# Patient Record
Sex: Female | Born: 1955 | Race: White | Hispanic: No | Marital: Married | Smoking: Never smoker
Health system: Southern US, Community
[De-identification: ages and names within clinical notes are randomized; demographics above are authoritative.]

## PROBLEM LIST (undated history)

## (undated) DIAGNOSIS — C539 Malignant neoplasm of cervix uteri, unspecified: Secondary | ICD-10-CM

## (undated) DIAGNOSIS — Z860100 Personal history of colon polyps, unspecified: Secondary | ICD-10-CM

## (undated) DIAGNOSIS — C50919 Malignant neoplasm of unspecified site of unspecified female breast: Secondary | ICD-10-CM

## (undated) HISTORY — DX: Malignant neoplasm of cervix uteri, unspecified: C53.9

## (undated) HISTORY — PX: REDUCTION MAMMAPLASTY: SUR839

## (undated) HISTORY — PX: COLONOSCOPY: SHX174

## (undated) HISTORY — PX: MASTECTOMY: SHX3

## (undated) HISTORY — DX: Malignant neoplasm of unspecified site of unspecified female breast: C50.919

## (undated) HISTORY — DX: Personal history of colon polyps, unspecified: Z86.0100

## (undated) HISTORY — PX: ABDOMINAL HYSTERECTOMY: SHX81

## (undated) HISTORY — DX: Personal history of colonic polyps: Z86.010

---

## 1989-09-07 DIAGNOSIS — C539 Malignant neoplasm of cervix uteri, unspecified: Secondary | ICD-10-CM

## 1989-09-07 HISTORY — DX: Malignant neoplasm of cervix uteri, unspecified: C53.9

## 1999-04-28 ENCOUNTER — Other Ambulatory Visit: Admission: RE | Admit: 1999-04-28 | Discharge: 1999-04-28 | Payer: Self-pay | Admitting: Gynecology

## 2001-06-13 ENCOUNTER — Other Ambulatory Visit: Admission: RE | Admit: 2001-06-13 | Discharge: 2001-06-13 | Payer: Self-pay | Admitting: Gynecology

## 2002-09-07 HISTORY — PX: BREAST LUMPECTOMY: SHX2

## 2002-12-25 ENCOUNTER — Other Ambulatory Visit: Admission: RE | Admit: 2002-12-25 | Discharge: 2002-12-25 | Payer: Self-pay | Admitting: Gynecology

## 2003-01-18 ENCOUNTER — Other Ambulatory Visit: Admission: RE | Admit: 2003-01-18 | Discharge: 2003-01-18 | Payer: Self-pay | Admitting: *Deleted

## 2003-01-18 ENCOUNTER — Encounter (INDEPENDENT_AMBULATORY_CARE_PROVIDER_SITE_OTHER): Payer: Self-pay | Admitting: *Deleted

## 2003-01-18 ENCOUNTER — Encounter: Admission: RE | Admit: 2003-01-18 | Discharge: 2003-01-18 | Payer: Self-pay | Admitting: *Deleted

## 2003-01-23 ENCOUNTER — Encounter (HOSPITAL_COMMUNITY): Admission: RE | Admit: 2003-01-23 | Discharge: 2003-04-23 | Payer: Self-pay | Admitting: *Deleted

## 2003-02-07 ENCOUNTER — Encounter: Admission: RE | Admit: 2003-02-07 | Discharge: 2003-02-07 | Payer: Self-pay | Admitting: *Deleted

## 2003-02-07 ENCOUNTER — Ambulatory Visit (HOSPITAL_BASED_OUTPATIENT_CLINIC_OR_DEPARTMENT_OTHER): Admission: RE | Admit: 2003-02-07 | Discharge: 2003-02-07 | Payer: Self-pay | Admitting: *Deleted

## 2003-02-07 ENCOUNTER — Encounter (INDEPENDENT_AMBULATORY_CARE_PROVIDER_SITE_OTHER): Payer: Self-pay | Admitting: *Deleted

## 2003-02-18 ENCOUNTER — Emergency Department (HOSPITAL_COMMUNITY): Admission: EM | Admit: 2003-02-18 | Discharge: 2003-02-18 | Payer: Self-pay | Admitting: Emergency Medicine

## 2003-03-07 ENCOUNTER — Ambulatory Visit (HOSPITAL_BASED_OUTPATIENT_CLINIC_OR_DEPARTMENT_OTHER): Admission: RE | Admit: 2003-03-07 | Discharge: 2003-03-07 | Payer: Self-pay | Admitting: *Deleted

## 2003-03-07 ENCOUNTER — Encounter (INDEPENDENT_AMBULATORY_CARE_PROVIDER_SITE_OTHER): Payer: Self-pay | Admitting: Specialist

## 2003-03-26 ENCOUNTER — Ambulatory Visit: Admission: RE | Admit: 2003-03-26 | Discharge: 2003-04-04 | Payer: Self-pay | Admitting: *Deleted

## 2003-05-16 ENCOUNTER — Ambulatory Visit: Admission: RE | Admit: 2003-05-16 | Discharge: 2003-07-11 | Payer: Self-pay | Admitting: *Deleted

## 2003-08-10 ENCOUNTER — Ambulatory Visit: Admission: RE | Admit: 2003-08-10 | Discharge: 2003-08-10 | Payer: Self-pay | Admitting: *Deleted

## 2003-12-26 ENCOUNTER — Other Ambulatory Visit: Admission: RE | Admit: 2003-12-26 | Discharge: 2003-12-26 | Payer: Self-pay | Admitting: Gynecology

## 2004-11-28 ENCOUNTER — Ambulatory Visit: Admission: RE | Admit: 2004-11-28 | Discharge: 2004-11-28 | Payer: Self-pay | Admitting: Family Medicine

## 2005-04-13 ENCOUNTER — Other Ambulatory Visit: Admission: RE | Admit: 2005-04-13 | Discharge: 2005-04-13 | Payer: Self-pay | Admitting: Gynecology

## 2006-04-14 ENCOUNTER — Other Ambulatory Visit: Admission: RE | Admit: 2006-04-14 | Discharge: 2006-04-14 | Payer: Self-pay | Admitting: Gynecology

## 2008-03-13 ENCOUNTER — Other Ambulatory Visit: Admission: RE | Admit: 2008-03-13 | Discharge: 2008-03-13 | Payer: Self-pay | Admitting: Gynecology

## 2008-10-25 ENCOUNTER — Inpatient Hospital Stay (HOSPITAL_COMMUNITY): Admission: RE | Admit: 2008-10-25 | Discharge: 2008-10-27 | Payer: Self-pay | Admitting: General Surgery

## 2008-10-25 ENCOUNTER — Encounter (INDEPENDENT_AMBULATORY_CARE_PROVIDER_SITE_OTHER): Payer: Self-pay | Admitting: General Surgery

## 2008-12-21 ENCOUNTER — Ambulatory Visit: Payer: Self-pay | Admitting: Oncology

## 2009-04-02 ENCOUNTER — Ambulatory Visit: Payer: Self-pay | Admitting: Internal Medicine

## 2009-04-12 ENCOUNTER — Ambulatory Visit: Payer: Self-pay | Admitting: Internal Medicine

## 2009-04-12 ENCOUNTER — Encounter: Payer: Self-pay | Admitting: Internal Medicine

## 2009-04-20 ENCOUNTER — Encounter: Payer: Self-pay | Admitting: Internal Medicine

## 2009-08-06 ENCOUNTER — Ambulatory Visit: Payer: Self-pay | Admitting: Gynecology

## 2009-08-06 ENCOUNTER — Other Ambulatory Visit: Admission: RE | Admit: 2009-08-06 | Discharge: 2009-08-06 | Payer: Self-pay | Admitting: Gynecology

## 2010-09-09 ENCOUNTER — Ambulatory Visit: Admit: 2010-09-09 | Payer: Self-pay | Admitting: Gynecology

## 2010-09-29 ENCOUNTER — Encounter: Payer: Self-pay | Admitting: Neurology

## 2010-10-10 ENCOUNTER — Other Ambulatory Visit: Payer: Self-pay | Admitting: Obstetrics and Gynecology

## 2010-10-10 ENCOUNTER — Other Ambulatory Visit (HOSPITAL_COMMUNITY)
Admission: RE | Admit: 2010-10-10 | Discharge: 2010-10-10 | Disposition: A | Discharge status: Home / Self Care | Payer: BC Managed Care – PPO | Source: Ambulatory Visit | Attending: Gynecology | Admitting: Gynecology

## 2010-10-10 ENCOUNTER — Encounter: Payer: BC Managed Care – PPO | Admitting: Gynecology

## 2010-10-10 DIAGNOSIS — Z124 Encounter for screening for malignant neoplasm of cervix: Secondary | ICD-10-CM | POA: Insufficient documentation

## 2010-10-10 DIAGNOSIS — B3731 Acute candidiasis of vulva and vagina: Secondary | ICD-10-CM

## 2010-10-10 DIAGNOSIS — Z01419 Encounter for gynecological examination (general) (routine) without abnormal findings: Secondary | ICD-10-CM

## 2010-10-10 DIAGNOSIS — Z13220 Encounter for screening for lipoid disorders: Secondary | ICD-10-CM

## 2010-10-10 DIAGNOSIS — N9089 Other specified noninflammatory disorders of vulva and perineum: Secondary | ICD-10-CM

## 2010-10-10 DIAGNOSIS — B373 Candidiasis of vulva and vagina: Secondary | ICD-10-CM

## 2010-11-10 ENCOUNTER — Other Ambulatory Visit: Payer: BC Managed Care – PPO

## 2010-12-23 LAB — COMPREHENSIVE METABOLIC PANEL
BUN: 14 mg/dL (ref 6–23)
Calcium: 9.3 mg/dL (ref 8.4–10.5)
Glucose, Bld: 130 mg/dL — ABNORMAL HIGH (ref 70–99)
Total Protein: 6.5 g/dL (ref 6.0–8.3)

## 2010-12-23 LAB — CBC
HCT: 40 % (ref 36.0–46.0)
Hemoglobin: 14.2 g/dL (ref 12.0–15.0)
MCHC: 35.4 g/dL (ref 30.0–36.0)
Platelets: 263 10*3/uL (ref 150–400)
RDW: 12.4 % (ref 11.5–15.5)

## 2010-12-23 LAB — DIFFERENTIAL
Lymphocytes Relative: 25 % (ref 12–46)
Lymphs Abs: 1.5 10*3/uL (ref 0.7–4.0)
Monocytes Relative: 7 % (ref 3–12)
Neutro Abs: 3.9 10*3/uL (ref 1.7–7.7)
Neutrophils Relative %: 66 % (ref 43–77)

## 2010-12-23 LAB — HEMOGLOBIN AND HEMATOCRIT, BLOOD: Hemoglobin: 12 g/dL (ref 12.0–15.0)

## 2011-01-20 NOTE — Discharge Summary (Signed)
[name], Robin Curry NO.:  [id number]   MEDICAL RECORD NO.:  [id number]          PATIENT TYPE:  INP   LOCATION:  5158                         FACILITY:  [facility]   PHYSICIAN:  Etter Sjogren, M.D.     DATE OF BIRTH:  04/25/1956   DATE OF ADMISSION:  10/25/2008  DATE OF DISCHARGE:  10/27/2008                               DISCHARGE SUMMARY   FINAL DIAGNOSIS:  Left breast cancer.   PROCEDURES PERFORMED:  1. Left total mastectomy.  2. Left sentinel node.  3. Left breast reconstruction latissimus dorsi myocutaneous flap.  4. Placement of a saline implant, left chest.  5. Placed ON-Q pump device.   SUMMARY OF HISTORY AND PHYSICAL:  This 55 year old woman has breast  cancer and desired reconstruction.  Options discussed.  She was  scheduled for a mastectomy and did desire reconstruction and she was not  a good candidate for the TRAM reconstruction because of the high BMI,  and the options of tissue expander with eventual placement of implant  versus using her latissimus muscle in the implant were discussed, and  she preferred to use her latissimus muscle and implant.  This procedure  was discussed with her in great detail, including the risks and possible  complications and she understood all this and wished to proceed.   COURSE IN HOSPITAL:  On admission, she was taken to surgery at which  time the mastectomy with the sentinel lymph node and the reconstruction  with latissimus muscle flap and the implant and placement of the ON-Q  catheters were performed.  She tolerated these procedures extremely  well.   Postoperatively, she progressed very well.  She remained afebrile.  Drains were functional.  The flap maintained excellent color.  Capillary  refill at 1-2 seconds, viable, and the donor site was fine.  I did not  notice any hematoma.  She was ambulating on the first postoperative day  and tolerating diet and continued to progress well.  Is tolerating p.o.  pain  pills by the first and second postoperative day, and it is felt  that she is ready to be discharged.  Her final hemoglobin was over 12.   DISPOSITION:  Discharged on regular diet.  No shower.  No lifting.  No  raising arm overhead.  Empty the drains at least 3 times a day and  record the amount separately.  Use a spirometer at home at least 8 times  a day.   PRESCRIPTIONS:  1. Keflex 500 mg p.o. q.i.d.  2. Robaxin 500 mg q.8 h.  3. Percocet 5 mg, a total of 40 given, one p.o. q.4 h. or 2 p.o. q.6      h. p.r.n. pain.   Followup in the office in about a week.  She will call the office at the  end of week to report the amount of drainage.  She will also follow up  with Dr. Dwain Sarna of her path results as well.      Etter Sjogren, M.D.  Electronically Signed     DB/MEDQ  D:  10/27/2008  T:  10/27/2008  Job:  [id number]

## 2011-01-20 NOTE — Op Note (Signed)
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   POSTOPERATIVE DIAGNOSIS:  Recurrent left breast ductal carcinoma in  situ.   PROCEDURES:  1. Left simple mastectomy.  2. Injection of methylene blue dye  3. Left axillary sentinel lymph node biopsy.   SURGEON:  Juanetta Gosling, MD   ASSISTANT:  Ardeth Sportsman, MD   ANESTHESIA:  General.   SPECIMENS:  Left breast oriented and 3 sentinel lymph nodes to  pathology.   ESTIMATED BLOOD LOSS:  Minimal.   COMPLICATIONS:  None.   DRAINS:  Per Dr. Odis Luster.   DISPOSITION:  Case turned over to Dr. Etter Sjogren for reconstruction at  the completion.   HISTORY:  Robin Curry is a 55 year old female in 2004 underwent a left  breast lumpectomy for high-grade DCIS.  The margin was positive at the  first and she underwent a re-excision of this area.  There was no  further DCIS present.  Postoperatively, this was complicated by an open  wound that took a number of weeks to heel and this is followed by  radiation.  She had an abnormal mammogram that was followed about 6  months closely with another one, then on January 22, underwent a  stereotactic biopsy which showed a focal high-grade DCIS associated with  necrosis and calcifications.  She then saw me for evaluation.  We  discussed a left mastectomy with attempted sentinel lymph node biopsy,  given her prior treatment also.  She was seen by Dr. Etter Sjogren who  plans to do a latissimus flap as well as an implant at the same time.   PROCEDURE:  After informed consent was obtained, the patient first was  injected  with radioactive tracer.  She was administered 1 g of  intravenous cefazolin.  Sequential compression devices were placed on  her lower extremities throughout the operation.  Her left breast was  then prepped and draped in a standard sterile surgical fashion with  chlorhexidine.  A surgical time-out was performed.   An teardrop-shaped incision encompassing her old incision was then made.  Dissection was carried out down to level of the breast tissue.  Flaps  were created in all directions to the clavicle, below the inframammary  crease, the sternum, and the latissimus.  These flaps were difficult to  develop likely due to her prior radiation.  There were several areas on  the flaps that were bleeding that I controlled with electrocautery as  well as several sutures.  Upon completing this, the breast was then  removed from the pectoralis muscle with some difficulty.  It was hard to  develop a plane between the pectoralis and the fascia, but the fascia  was removed along with the breast tissue.  A  stick tie was placed in a  perforator that was visualized at this time.  The breast was then taken  laterally and released at the point of the latissimus.  I then placed a  probe on the axillary tail and noted 2 sentinel lymph nodes that were  present in the axillary tail that were both hot and blue.  I also  identified an additional sentinel lymph node x2 present in her axilla  with counts in the 2500 range.  The counts upon completion were about  100.  Hemostasis was observed.  Irrigation was performed.  I then  stapled the skin close and stapled a sterile towel over the incision.  This case was then turned over to Dr. Etter Sjogren for reconstruction.      Juanetta Gosling, MD  Electronically Signed     MCW/MEDQ  D:  10/25/2008  T:  10/26/2008  Job:  [id number]   cc:   Marcial Pacas P. Fontaine, M.D.

## 2011-01-20 NOTE — Op Note (Signed)
Robin Curry, Robin Curry NO.:  [id number]   MEDICAL RECORD NO.:  [id number]          PATIENT TYPE:  INP   LOCATION:  2899                         FACILITY:  [facility]   PHYSICIAN:  Etter Sjogren, M.D.     DATE OF BIRTH:  02-Oct-1955   DATE OF PROCEDURE:  10/25/2008  DATE OF DISCHARGE:                               OPERATIVE REPORT   PREOPERATIVE DIAGNOSIS:  Left breast cancer.   POSTOPERATIVE DIAGNOSIS:  Left breast cancer.   PROCEDURE:  1. Left latissimus dorsi myocutaneous flap.  2. Placement of a saline implant.  3. Placement of On-Q pump catheters.   SURGEON:  Etter Sjogren, MD   ASSISTANT:  Pleas Patricia, MD   ANESTHESIA:  General.   ESTIMATED BLOOD LOSS:  125 mL.   DRAINS:  Two Blakes through the back and two Blakes for the front and  anterior chest.   CLINICAL NOTE:  A 55 year old woman has breast cancer, has had previous  radiation and had a failure and now presents for mastectomy and she is  very interested in reconstruction.  Options discussed due to her high  BMI and the need for autogenous tissue given the previous radiation, it  was felt that latissimus flap was a very good option for her and that  the TRAM flap would not be a good option because of significant risk to  the donor site of the abdomen.  This procedure was discussed with her in  great detail and she understood the initial procedure and the risks and  the fact that further adjustments maybe needed and risks including but  not limited to bleeding, infection, anesthesia complications, healing  problems, scarring, loss of sensation, fluid collections, asymmetry,  displacing the implant for the implant capsular contracture,  disappointment, possibly further surgeries, pulmonary embolism, damage  to the chest cavity, pneumothorax, and she understood all this and  wished to proceed.   PROCEDURE:  The patient had been marked this morning in the office for  the reconstruction.  The  mastectomy had been completed.  She was placed  in a right lateral decubitus position on the beanbag with a sterile  dressing covering the closed mastectomy site.  The sterile dressing was  then removed.  She was prepped with Betadine and draped with sterile  drapes.  The skin paddle was then incised, dissection was carried out  through the subcutaneous tissue identifying the underlying latissimus  muscle.  The muscle was exposed anteriorly, the posterior surface of the  muscle was then freed by taking great care to avoid damage to the  thoracodorsal pedicle.  The muscle was then divided distally and then  posteriorly with perforating lumbar vessels tied with 3-0 Vicryl sutures  and a smaller perforating vessels double ligated with Ligaclips.  The  flap having been elevated had excellent color and was transposed into  the mastectomy wound where subcutaneous tunnel then made connecting to  the mastectomy wound.  Thorough irrigation with saline, hemostasis with  electrocautery.  After hemostasis had been confirmed, 2 Blake drains  were positioned, brought through separate stab wounds anteroinferiorly  and secured with 3-0 Prolene  sutures and then the On-Q catheters were  placed.  The needles were passed.  The On-Q catheters were fed through  the sheath.  Sheath was removed, the catheters were positioned  superiorly and inferiorly over the muscle bed.  These were then affixed  to the skin using Steri-Strips and a Tegaderm and the flow rate set at 2  mL/hour/catheter.  Hemostasis again confirmed and the closure of the  deep wound with #1 PDS in interrupted inverted sutures, 2-0 Monocryl  interrupted inverted deep dermal sutures, and 3-0 Monocryl running  subcuticular suture.  Steri-Strips and dry sterile dressing and this was  covered with a large OpSite.  The patient was then gently rolled into  supine position there and again the mastectomy wound was covered with  sterile dressing.  She was  prepped with Betadine and draped with sterile  drapes.  The skin staples that had closed the mastectomy wound was then  opened.  The skin paddle was inspected.  It had excellent color and good  capillary refill 1-2 seconds with a healthy muscle color as well.  The  muscle was then set along as inferior and lateral border using 3-0  Vicryl interrupted figure-of-eight sutures and then thoroughly  irrigating with saline and Blake drains was positioned, one in left  under the muscle flap and the other one in left underneath the  mastectomy flaps.  After hemostasis having been confirmed and achieved,  the antibiotic solution was then placed in the wound and allowed to  dwell and the implant was prepared.  After thoroughly cleaning gloves,  the implant was prepared.  This was a Mentor smooth, round, moderate  profile saline implant 325 plus 50, maximum fill 375 mL, plot number  [id number].  A 250 mL of sterile saline placed, the air was removed and  then the implant was positioned underneath the muscle flap.  A 3-0  Vicryl sutures were then placed to complete a portion of the superior  closure.  The implant was filled to the maximum of 375 mL, all of this  using a closed fill system, but with saline.  The implant was soaked in  antibiotic solution prior to implantation.  It was soaked for greater  than 5 minutes.  The muscle closure was completed superiorly taking  great care to avoid damage underlying implant with 3-0 Vicryl  interrupted figure-of-eight sutures.  A thorough irrigation again with  saline, hemostasis confirmed and the closure with 3-0 Monocryl inverted  deep dermal sutures.  At either end of the flap, there was a little bit  of extra skin and this was incised and de-epithelialized and buried for  additional volume.  The closure was completed with 3-0 Monocryl inverted  deep dermal sutures and running 3-0 Monocryl subcuticular suture.  The  flap continued to have excellent color and  certainly had a nice bleeding  when the either end both anterior and posterior were de-epithelialized.  Steri-Strips, dry sterile dressing lightly applied and she was  transferred to the recovery room stable, had tolerated the procedure  well.      Etter Sjogren, M.D.  Electronically Signed     DB/MEDQ  D:  10/25/2008  T:  10/26/2008  Job:  [id number]

## 2011-01-23 NOTE — Op Note (Signed)
   NAME:  Robin Curry, Robin Curry                     ACCOUNT NO.:  [id number]   MEDICAL RECORD NO.:  [id number]                   PATIENT TYPE:  AMB   LOCATION:  DSC                                  FACILITY:  [facility]   PHYSICIAN:  Vikki Ports, M.D.         DATE OF BIRTH:  28-Sep-1955   DATE OF PROCEDURE:  02/07/2003  DATE OF DISCHARGE:                                 OPERATIVE REPORT   PREOPERATIVE DIAGNOSIS:  Ductal carcinoma in situ of the left breast.   POSTOPERATIVE DIAGNOSIS:  Ductal carcinoma in situ of the left breast.   OPERATION:  Needle localization, left partial mastectomy with specimen  mammography.   SURGEON:  Vikki Ports, M.D.   ANESTHESIA:  General   DESCRIPTION OF PROCEDURE:  The patient was taken to the operating room and  placed in the supine position.  After adequate general anesthesia was  induced, the left breast was prepped and draped in the normal sterile  fashion.  Using a vertically oriented incision over the lateral portion of  the left breast along the tract of the guide wire I excised all tissues to  the retroareolar space.  Cavity was marked with clips.  The margins were  marked with radiopaque markers and the calcifications are present on the  mammograms and the margins appear clear radiographically.  Adequate  hemostasis was assured and the skin was closed with subcuticular 4-0  Monocryl.  Steri-Strips and sterile dressings were applied.  The patient  tolerated the procedure well and went to PACU in good condition.                                               Vikki Ports, M.D.    KRH/MEDQ  D:  02/13/2003  T:  02/13/2003  Job:  [id number]

## 2011-01-23 NOTE — Op Note (Signed)
   NAME:  Robin Curry, Robin Curry                     ACCOUNT NO.:  [id number]   MEDICAL RECORD NO.:  [id number]                   PATIENT TYPE:  AMB   LOCATION:  DSC                                  FACILITY:  [facility]   PHYSICIAN:  Vikki Ports, M.D.         DATE OF BIRTH:  [birth year]   DATE OF PROCEDURE:  03/07/2003  DATE OF DISCHARGE:  03/07/2003                                 OPERATIVE REPORT   PREOPERATIVE DIAGNOSIS:  Ductal carcinoma in situ of the left breast with  involvement of margins from previous partial mastectomy.   POSTOPERATIVE DIAGNOSIS:  Ductal carcinoma in situ of the left breast with  involvement of margins from previous partial mastectomy.   PROCEDURE:  Wide excision of left breast ductal carcinoma in situ upper  outer quadrant.   ANESTHESIA:  General.   DESCRIPTION OF PROCEDURE:  The patient was taken to the operating room and  placed in a supine position.  After a general anesthesia was induced, the  left breast was prepped and draped in the normal sterile fashion.  An  elliptical incision was made around the previous incision site and the  cavity was entered.  Medial, inferior, and superior margins were taken at a  depth of an additional 1 cm.  The new margins were clipped with hemoclips  for radiologic identification or margins.  Adequate hemostasis of the cavity  was insured.  It was closed with running subcutaneous 3-0 Vicryl and a  subcuticular 4-0 Monocryl. Steri-Strip and sterile dressings were applied.  The patient tolerated the procedure well and went to the PACU in good  condition.                                               Vikki Ports, M.D.    KRH/MEDQ  D:  04/16/2003  T:  04/16/2003  Job:  [id number]

## 2012-03-07 ENCOUNTER — Encounter: Payer: Self-pay | Admitting: Internal Medicine

## 2012-03-08 ENCOUNTER — Encounter: Payer: Self-pay | Admitting: Gastroenterology

## 2012-04-08 ENCOUNTER — Other Ambulatory Visit: Payer: Self-pay | Admitting: Gynecology

## 2012-04-08 DIAGNOSIS — Z1231 Encounter for screening mammogram for malignant neoplasm of breast: Secondary | ICD-10-CM

## 2012-04-08 DIAGNOSIS — Z9012 Acquired absence of left breast and nipple: Secondary | ICD-10-CM

## 2012-04-29 ENCOUNTER — Ambulatory Visit
Admission: RE | Admit: 2012-04-29 | Discharge: 2012-04-29 | Disposition: A | Discharge status: Home / Self Care | Payer: BC Managed Care – PPO | Source: Ambulatory Visit | Attending: Gynecology | Admitting: Gynecology

## 2012-04-29 DIAGNOSIS — Z9012 Acquired absence of left breast and nipple: Secondary | ICD-10-CM

## 2012-04-29 DIAGNOSIS — Z1231 Encounter for screening mammogram for malignant neoplasm of breast: Secondary | ICD-10-CM

## 2012-08-02 ENCOUNTER — Encounter: Payer: Self-pay | Admitting: Internal Medicine

## 2012-09-08 ENCOUNTER — Ambulatory Visit (AMBULATORY_SURGERY_CENTER): Payer: BC Managed Care – PPO

## 2012-09-08 ENCOUNTER — Encounter: Payer: Self-pay | Admitting: Internal Medicine

## 2012-09-08 VITALS — Ht 61.0 in | Wt 236.6 lb

## 2012-09-08 DIAGNOSIS — Z860100 Personal history of colon polyps, unspecified: Secondary | ICD-10-CM

## 2012-09-08 MED ORDER — NA SULFATE-K SULFATE-MG SULF 17.5-3.13-1.6 GM/177ML PO SOLN: 1.0000 | Freq: Once | ORAL | Status: DC

## 2012-09-19 ENCOUNTER — Ambulatory Visit (AMBULATORY_SURGERY_CENTER): Payer: BC Managed Care – PPO | Admitting: Internal Medicine

## 2012-09-19 ENCOUNTER — Encounter: Payer: Self-pay | Admitting: Internal Medicine

## 2012-09-19 VITALS — BP 143/74 | HR 68 | Temp 97.5°F | Resp 16 | Ht 61.0 in | Wt 236.0 lb

## 2012-09-19 DIAGNOSIS — Z860100 Personal history of colon polyps, unspecified: Secondary | ICD-10-CM | POA: Insufficient documentation

## 2012-09-19 DIAGNOSIS — Z1211 Encounter for screening for malignant neoplasm of colon: Secondary | ICD-10-CM

## 2012-09-19 DIAGNOSIS — D126 Benign neoplasm of colon, unspecified: Secondary | ICD-10-CM

## 2012-09-19 MED ORDER — SODIUM CHLORIDE 0.9 % IV SOLN: 500.0000 mL | INTRAVENOUS | Status: DC

## 2012-09-19 NOTE — Progress Notes (Signed)
Patient did not experience any of the following events: a burn prior to discharge; a fall within the facility; wrong site/side/patient/procedure/implant event; or a hospital transfer or hospital admission upon discharge from the facility. (G8907) Patient did not have preoperative order for IV antibiotic SSI prophylaxis. (G8918)  

## 2012-09-19 NOTE — Patient Instructions (Addendum)
One tiny polyp was removed. I suspect I will recommend a repeat colonoscopy in 5 years but will let you know for certain by mail.  I will let Dr. Cliffton Asters know also.  Thank you for choosing me and [facility].  Iva Boop, MD, FACG   YOU HAD AN ENDOSCOPIC PROCEDURE TODAY AT THE [facility]: Refer to the procedure report that was given to you for any specific questions about what was found during the examination.  If the procedure report does not answer your questions, please call your gastroenterologist to clarify.  If you requested that your care partner not be given the details of your procedure findings, then the procedure report has been included in a sealed envelope for you to review at your convenience later.  YOU SHOULD EXPECT: Some feelings of bloating in the abdomen. Passage of more gas than usual.  Walking can help get rid of the air that was put into your GI tract during the procedure and reduce the bloating. If you had a lower endoscopy (such as a colonoscopy or flexible sigmoidoscopy) you may notice spotting of blood in your stool or on the toilet paper. If you underwent a bowel prep for your procedure, then you may not have a normal bowel movement for a few days.  DIET: Your first meal following the procedure should be a light meal and then it is ok to progress to your normal diet.  A half-sandwich or bowl of soup is an example of a good first meal.  Heavy or fried foods are harder to digest and may make you feel nauseous or bloated.  Likewise meals heavy in dairy and vegetables can cause extra gas to form and this can also increase the bloating.  Drink plenty of fluids but you should avoid alcoholic beverages for 24 hours.  ACTIVITY: Your care partner should take you home directly after the procedure.  You should plan to take it easy, moving slowly for the rest of the day.  You can resume normal activity the day after the procedure however you should NOT  DRIVE or use heavy machinery for 24 hours (because of the sedation medicines used during the test).    SYMPTOMS TO REPORT IMMEDIATELY: A gastroenterologist can be reached at any hour.  During normal business hours, 8:30 AM to 5:00 PM Monday through Friday, call [phone].  After hours and on weekends, please call the GI answering service at [phone] who will take a message and have the physician on call contact you.   Following lower endoscopy (colonoscopy or flexible sigmoidoscopy):  Excessive amounts of blood in the stool  Significant tenderness or worsening of abdominal pains  Swelling of the abdomen that is new, acute  Fever of 100F or higher    FOLLOW UP: If any biopsies were taken you will be contacted by phone or by letter within the next 1-3 weeks.  Call your gastroenterologist if you have not heard about the biopsies in 3 weeks.  Our staff will call the home number listed on your records the next business day following your procedure to check on you and address any questions or concerns that you may have at that time regarding the information given to you following your procedure. This is a courtesy call and so if there is no answer at the home number and we have not heard from you through the emergency physician on call, we will assume that you have returned to your regular daily activities  without incident.  SIGNATURES/CONFIDENTIALITY: You and/or your care partner have signed paperwork which will be entered into your electronic medical record.  These signatures attest to the fact that that the information above on your After Visit Summary has been reviewed and is understood.  Full responsibility of the confidentiality of this discharge information lies with you and/or your care-partner.    INFORMATION ON POLYPS GIVEN TO YOU TODAY

## 2012-09-19 NOTE — Progress Notes (Signed)
Called to room to assist during endoscopic procedure.  Patient ID and intended procedure confirmed with present staff. Received instructions for my participation in the procedure from the performing physician.  

## 2012-09-19 NOTE — Op Note (Signed)
[facility] [address] [place], [address]   COLONOSCOPY PROCEDURE REPORT  PATIENT: Robin, Curry  MR#: [id number] BIRTHDATE: Mar 14, 1956 , 56  yrs. old GENDER: Female ENDOSCOPIST: Iva Boop, MD, [facility] PROCEDURE DATE:  09/19/2012 PROCEDURE:   Colonoscopy with biopsy ASA CLASS:   Class II INDICATIONS:Screening and surveillance,personal history of colonic polyps. MEDICATIONS: propofol (Diprivan) 200mg  IV, MAC sedation, administered by CRNA, and These medications were titrated to patient response per physician's verbal order  DESCRIPTION OF PROCEDURE:   After the risks benefits and alternatives of the procedure were thoroughly explained, informed consent was obtained.  A digital rectal exam revealed no abnormalities of the rectum.   The LB [id number]  endoscope was introduced through the anus and advanced to the cecum, which was identified by both the appendix and ileocecal valve. No adverse events experienced.   The quality of the prep was Suprep excellent The instrument was then slowly withdrawn as the colon was fully examined.      COLON FINDINGS: A smooth sessile polyp measuring 2 mm in size was found at the hepatic flexure.  A polypectomy was performed with cold forceps.  The resection was complete and the polyp tissue was completely retrieved.   The colon mucosa was otherwise normal.   A right colon retroflexion was performed.  Retroflexed views revealed no abnormalities. The time to cecum=1 minutes 30 seconds. Withdrawal time=9 minutes 20 seconds.  The scope was withdrawn and the procedure completed. COMPLICATIONS: There were no complications.  ENDOSCOPIC IMPRESSION: 1.   Sessile polyp measuring 2 mm in size was found at the hepatic flexure; polypectomy was performed with cold forceps 2.   The colon mucosa was otherwise normal - excellent prep in patient with history of 7 mm tubulovillous adenoma removal in  2010  RECOMMENDATIONS: Timing of repeat colonoscopy will be determined by pathology findings.   eSigned:  Iva Boop, MD, [facility] 09/19/2012 11:56 AM   cc: The Patient and Laurann Montana, MD

## 2012-09-20 ENCOUNTER — Telehealth: Payer: Self-pay

## 2012-09-20 NOTE — Telephone Encounter (Signed)
  Follow up Call-  Call back number 09/19/2012  Post procedure Call Back phone  # [phone]  Permission to leave phone message Yes     Patient questions:  Do you have a fever, pain , or abdominal swelling? no Pain Score  0 *  Have you tolerated food without any problems? yes  Have you been able to return to your normal activities? yes  Do you have any questions about your discharge instructions: Diet   no Medications  no Follow up visit  no  Do you have questions or concerns about your Care? no  Actions: * If pain score is 4 or above: No action needed, pain <4.  Per the pt, "everything went great".  Maw

## 2012-09-22 ENCOUNTER — Encounter: Payer: Self-pay | Admitting: Internal Medicine

## 2012-09-22 NOTE — Progress Notes (Signed)
Quick Note:  Diminutive 2 mm adenoma Repeat colon about 09/2017 ______

## 2013-06-19 ENCOUNTER — Other Ambulatory Visit: Payer: Self-pay

## 2013-06-19 DIAGNOSIS — Z1231 Encounter for screening mammogram for malignant neoplasm of breast: Secondary | ICD-10-CM

## 2013-07-04 ENCOUNTER — Ambulatory Visit
Admission: RE | Admit: 2013-07-04 | Discharge: 2013-07-04 | Disposition: A | Discharge status: Home / Self Care | Payer: BC Managed Care – PPO | Source: Ambulatory Visit

## 2013-07-04 DIAGNOSIS — Z1231 Encounter for screening mammogram for malignant neoplasm of breast: Secondary | ICD-10-CM

## 2013-07-25 ENCOUNTER — Telehealth: Payer: Self-pay

## 2013-07-25 ENCOUNTER — Encounter: Payer: Self-pay | Admitting: Gynecology

## 2013-07-25 ENCOUNTER — Other Ambulatory Visit: Payer: Self-pay

## 2013-07-25 ENCOUNTER — Other Ambulatory Visit: Payer: Self-pay | Admitting: Gynecology

## 2013-07-25 ENCOUNTER — Other Ambulatory Visit (HOSPITAL_COMMUNITY)
Admission: RE | Admit: 2013-07-25 | Discharge: 2013-07-25 | Disposition: A | Discharge status: Home / Self Care | Payer: BC Managed Care – PPO | Source: Ambulatory Visit | Attending: Gynecology | Admitting: Gynecology

## 2013-07-25 ENCOUNTER — Ambulatory Visit (INDEPENDENT_AMBULATORY_CARE_PROVIDER_SITE_OTHER): Payer: BC Managed Care – PPO | Admitting: Gynecology

## 2013-07-25 VITALS — BP 120/78 | Ht 61.0 in | Wt 192.0 lb

## 2013-07-25 DIAGNOSIS — C50919 Malignant neoplasm of unspecified site of unspecified female breast: Secondary | ICD-10-CM

## 2013-07-25 DIAGNOSIS — K649 Unspecified hemorrhoids: Secondary | ICD-10-CM

## 2013-07-25 DIAGNOSIS — Z01419 Encounter for gynecological examination (general) (routine) without abnormal findings: Secondary | ICD-10-CM

## 2013-07-25 DIAGNOSIS — C539 Malignant neoplasm of cervix uteri, unspecified: Secondary | ICD-10-CM

## 2013-07-25 DIAGNOSIS — Z1151 Encounter for screening for human papillomavirus (HPV): Secondary | ICD-10-CM | POA: Insufficient documentation

## 2013-07-25 DIAGNOSIS — C50912 Malignant neoplasm of unspecified site of left female breast: Secondary | ICD-10-CM

## 2013-07-25 MED ORDER — ZOLPIDEM TARTRATE 10 MG PO TABS: 10.0000 mg | ORAL_TABLET | Freq: Every evening | ORAL | Status: DC | PRN

## 2013-07-25 NOTE — Telephone Encounter (Signed)
Ambien 10 mg #30 with 2 refills. 1 by mouth each bedtime when necessary insomnia

## 2013-07-25 NOTE — Patient Instructions (Signed)
Followup for Pap smear results. Followup in one year for annual exam.

## 2013-07-25 NOTE — Progress Notes (Signed)
This is well below one year if Robin Curry [birth year] [id number]        57 y.o.  G3P3003 for annual exam.  Several issues noted below.  Past medical history,surgical history, problem list, medications, allergies, family history and social history were all reviewed and documented in the EPIC chart.  ROS:  Performed and pertinent positives and negatives are included in the history, assessment and plan .  Exam: Kim assistant Filed Vitals:   07/25/13 0835  BP: 120/78  Height: 5\' 1"  (1.549 m)  Weight: 192 lb (87.091 kg)   General appearance  Normal Skin grossly normal Head/Neck normal with no cervical or supraclavicular adenopathy thyroid normal Lungs  clear Cardiac RR, without RMG Abdominal  soft, nontender, without masses, organomegaly or hernia Breasts  examined lying and sitting. Right without masses, retractions, discharge or axillary adenopathy. Well-healed right reduction scars. Left reconstruction with implant no acute changes, masses or axillary adenopathy. Pelvic  Ext/BUS/vagina  normal, Pap/HPV of the cuff.  Adnexa  Without masses or tenderness    Anus and perineum  normal   Rectovaginal  normal sphincter tone. Acute thrombosed external hemorrhoid at 12:00.  Assessment/Plan:  57 y.o. G28P3003 female for annual exam.   1. Postmenopausal. Status post TAH for adenocarcinoma of the cervix 1991. Doing well without significant symptoms of hot flushes night sweats vaginal dryness or dyspareunia. Will continue to monitor. 2. Adenocarcinoma of the endocervix.  Previously underwent cone biopsy that showed microinvasion during pregnancy with repeat cone biopsy negative after pregnancy. Subsequently had TAH which did show residual adenocarcinoma in situ 1991. Paps have been negative since then. Pap of cuff done today. Will continue with annual cytology. 3. Breast cancer status post initial lumpectomy ultimate mastectomy on the left with reconstruction. Mammography 06/2013. Continue  with annual mammography. SBE monthly reviewed.  4. Thrombosed hemorrhoid. Most recent over the last week or so, uncomfortable to the patient. Samples of Recticare given. If persists then we'll refer to Gen. surgery for lancing. 5. History of adenomatous polyps. Colonoscopy 09/2012. Followup at gastroenterology recommendations. 6. DEXA. We'll plan further into the menopause. Increase calcium and vitamin D reviewed. 7. Insomnia. Patient has occasional insomnia for which she uses Ambien 10 mg. The patient will call when she needs refill. 8. Health maintenance. Patient actively being followed by her primary physician who does all of her routine blood work. Followup one year, sooner as needed.   Note: This document was prepared with digital dictation and possible smart phrase technology. Any transcriptional errors that result from this process are unintentional.   Dara Lords MD, 9:19 AM 07/25/2013

## 2013-07-25 NOTE — Telephone Encounter (Signed)
I received an email from patient that said she forgot to get her Rx for Ambien and could it be called in to her [facility].  Rx?

## 2013-07-25 NOTE — Telephone Encounter (Signed)
Called in to pharmacy.  I emailed patient back in My Chart to let her know.

## 2014-06-22 ENCOUNTER — Other Ambulatory Visit: Payer: Self-pay

## 2014-07-09 ENCOUNTER — Encounter: Payer: Self-pay | Admitting: Gynecology

## 2015-02-08 ENCOUNTER — Encounter: Payer: Self-pay | Admitting: Internal Medicine

## 2015-10-23 ENCOUNTER — Other Ambulatory Visit: Payer: Self-pay

## 2015-10-23 DIAGNOSIS — Z9882 Breast implant status: Secondary | ICD-10-CM

## 2015-10-23 DIAGNOSIS — Z1231 Encounter for screening mammogram for malignant neoplasm of breast: Secondary | ICD-10-CM

## 2015-10-23 DIAGNOSIS — Z9012 Acquired absence of left breast and nipple: Secondary | ICD-10-CM

## 2015-10-29 ENCOUNTER — Ambulatory Visit
Admission: RE | Admit: 2015-10-29 | Discharge: 2015-10-29 | Disposition: A | Discharge status: Home / Self Care | Payer: BLUE CROSS/BLUE SHIELD | Source: Ambulatory Visit

## 2015-10-29 ENCOUNTER — Other Ambulatory Visit: Payer: Self-pay

## 2015-10-29 DIAGNOSIS — Z1231 Encounter for screening mammogram for malignant neoplasm of breast: Secondary | ICD-10-CM

## 2015-10-29 DIAGNOSIS — Z9012 Acquired absence of left breast and nipple: Secondary | ICD-10-CM

## 2015-10-29 DIAGNOSIS — Z9882 Breast implant status: Secondary | ICD-10-CM

## 2015-11-28 ENCOUNTER — Ambulatory Visit (INDEPENDENT_AMBULATORY_CARE_PROVIDER_SITE_OTHER): Payer: BLUE CROSS/BLUE SHIELD | Admitting: Gynecology

## 2015-11-28 ENCOUNTER — Encounter: Payer: Self-pay | Admitting: Gynecology

## 2015-11-28 VITALS — BP 120/78 | Ht 62.0 in | Wt 147.0 lb

## 2015-11-28 DIAGNOSIS — Z01419 Encounter for gynecological examination (general) (routine) without abnormal findings: Secondary | ICD-10-CM | POA: Diagnosis not present

## 2015-11-28 MED ORDER — ZOLPIDEM TARTRATE 10 MG PO TABS: 10.0000 mg | ORAL_TABLET | Freq: Every evening | ORAL | Status: DC | PRN

## 2015-11-28 NOTE — Progress Notes (Signed)
    Robin Curry 02-23-1956 [id number]        60 y.o.  G3P3003  for annual exam.  Has not been in since 2014. Doing well without complaints.  Past medical history,surgical history, problem list, medications, allergies, family history and social history were all reviewed and documented as reviewed in the EPIC chart.  ROS:  Performed with pertinent positives and negatives included in the history, assessment and plan.   Additional significant findings :  none   Exam: Caryn Bee assistant Filed Vitals:   11/28/15 1108  BP: 120/78  Height: 5\' 2"  (1.575 m)  Weight: 147 lb (66.679 kg)   General appearance:  Normal affect, orientation and appearance. Skin: Grossly normal HEENT: Without gross lesions.  No cervical or supraclavicular adenopathy. Thyroid normal.  Lungs:  Clear without wheezing, rales or rhonchi Cardiac: RR, without RMG Abdominal:  Soft, nontender, without masses, guarding, rebound, organomegaly or hernia Breasts:  Examined lying and sitting. Right without masses, retractions, discharge or axillary adenopathy.  Left status post reconstruction with implant. No masses or axillary adenopathy Pelvic:  Ext/BUS/vagina with atrophic changes, Pap smear of vaginal cuff done  Adnexa without masses or tenderness    Anus and perineum normal   Rectovaginal normal sphincter tone without palpated masses or tenderness.    Assessment/Plan:  60 y.o. G18P3003 female for annual exam.   1. Postmenopausal/atrophic genital changes. Status post TAH for adenocarcinoma of the cervix 1991. Doing well without significant how flushes or night sweats. 2. Adenocarcinoma of the endocervix. Underwent cone biopsy with microinvasion during pregnancy with repeat cone biopsy negative after pregnancy. Ultimately underwent TAH which did show residual adenocarcinoma in situ 1991. Pap smear of cuff done today. Recommend continuing annual Pap smears. 3. Breast cancer status post initial lumpectomy and  ultimate left mastectomy with reconstruction. Mammography 10/2015. Contemplating undergoing further surgery to better match her right and left breasts. Continue with annual mammography. SBE monthly reviewed. 4. Colonoscopy 2014. Repeat at their recommended interval. 5. DEXA never. Will plan further into the menopause. 6. Insomnia. Occasionally uses Ambien 10 mg. #30 with 1 refill provided. 7. Health maintenance. Has lost a substantial amount of weight since I last saw her. Reports routine blood work done elsewhere and all normal. Follow up in one year, sooner as needed.   Anastasio Auerbach MD, 11:30 AM 11/28/2015

## 2015-11-28 NOTE — Addendum Note (Signed)
Addended by: Nelva Nay on: 11/28/2015 11:48 AM   Modules accepted: Orders

## 2015-11-28 NOTE — Patient Instructions (Signed)

## 2015-11-29 LAB — PAP IG W/ RFLX HPV ASCU

## 2016-06-06 ENCOUNTER — Other Ambulatory Visit: Payer: Self-pay | Admitting: Family Medicine

## 2016-06-06 DIAGNOSIS — G4484 Primary exertional headache: Secondary | ICD-10-CM

## 2016-06-19 ENCOUNTER — Other Ambulatory Visit: Payer: BLUE CROSS/BLUE SHIELD

## 2016-06-19 ENCOUNTER — Ambulatory Visit
Admission: RE | Admit: 2016-06-19 | Discharge: 2016-06-19 | Disposition: A | Discharge status: Home / Self Care | Payer: BLUE CROSS/BLUE SHIELD | Source: Ambulatory Visit | Attending: Family Medicine | Admitting: Family Medicine

## 2016-06-19 DIAGNOSIS — G4484 Primary exertional headache: Secondary | ICD-10-CM

## 2016-06-19 MED ORDER — GADOBENATE DIMEGLUMINE 529 MG/ML IV SOLN
14.0000 mL | Freq: Once | INTRAVENOUS | Status: AC | PRN
  Administered 2016-06-19: 13 mL via INTRAVENOUS

## 2016-11-30 ENCOUNTER — Encounter: Payer: Self-pay | Admitting: Gynecology

## 2016-11-30 ENCOUNTER — Ambulatory Visit (INDEPENDENT_AMBULATORY_CARE_PROVIDER_SITE_OTHER): Payer: BLUE CROSS/BLUE SHIELD | Admitting: Gynecology

## 2016-11-30 VITALS — BP 110/70 | Ht 61.5 in | Wt 145.0 lb

## 2016-11-30 DIAGNOSIS — C50912 Malignant neoplasm of unspecified site of left female breast: Secondary | ICD-10-CM

## 2016-11-30 DIAGNOSIS — Z01411 Encounter for gynecological examination (general) (routine) with abnormal findings: Secondary | ICD-10-CM

## 2016-11-30 DIAGNOSIS — D069 Carcinoma in situ of cervix, unspecified: Secondary | ICD-10-CM

## 2016-11-30 NOTE — Patient Instructions (Signed)

## 2016-11-30 NOTE — Addendum Note (Signed)
Addended by: Thurnell Garbe A on: 11/30/2016 10:36 AM   Modules accepted: Orders

## 2016-11-30 NOTE — Progress Notes (Signed)
    [facility]in [facility] 10-Sep-1955 [id number]        61 y.o.  G3P3003 for annual exam.    Past medical history,surgical history, problem list, medications, allergies, family history and social history were all reviewed and documented as reviewed in the EPIC chart.  ROS:  Performed with pertinent positives and negatives included in the history, assessment and plan.   Additional significant findings :  None   Exam: Copywriter, advertising Vitals:   11/30/16 0902  BP: 110/70  Weight: 145 lb (65.8 kg)  Height: 5' 1.5" (1.562 m)   Body mass index is 26.95 kg/m.  General appearance:  Normal affect, orientation and appearance. Skin: Grossly normal HEENT: Without gross lesions.  No cervical or supraclavicular adenopathy. Thyroid normal.  Lungs:  Clear without wheezing, rales or rhonchi Cardiac: RR, without RMG Abdominal:  Soft, nontender, without masses, guarding, rebound, organomegaly or hernia Breasts:  Examined lying and sitting. Right without masses, retractions, discharge or axillary adenopathy.  Left status post reconstruction with implant. No masses or axillary adenopathy Pelvic:  Ext, BUS, Vagina: With atrophic changes. Pap smear cuff done  Adnexa: Without masses or tenderness    Anus and perineum: Normal   Rectovaginal: Normal sphincter tone without palpated masses or tenderness.    Assessment/Plan:  61 y.o. G37P3003 female for annual exam.   1. History of adenocarcinoma of the endocervix status post cone biopsy with microinvasion during pregnancy with repeat cone biopsy negative after pregnancy. Ultimately underwent TAH with residual adenocarcinoma in situ 1991. Pap smear cuff done today. 2. Postmenopausal/atrophic genital changes. No significant hot flushes, night sweats, vaginal dryness. 3. History of left breast cancer status post mastectomy with reconstruction. Mammography 2017. Continue with annual mammography. SBE monthly reviewed. 4. Colonoscopy 2014. Repeat at their  recommended interval. 5. DEXA never. Plan Baseline next year at age 14. 60. Health maintenance. No routine lab work done as patient relates this done elsewhere. Follow up 1 year, sooner as needed.   Anastasio Auerbach MD, 9:25 AM 11/30/2016

## 2016-12-03 ENCOUNTER — Encounter: Payer: Self-pay | Admitting: Gynecology

## 2016-12-03 LAB — PAP IG W/ RFLX HPV ASCU

## 2017-08-19 ENCOUNTER — Other Ambulatory Visit: Payer: Self-pay | Admitting: Gynecology

## 2017-08-19 DIAGNOSIS — Z1231 Encounter for screening mammogram for malignant neoplasm of breast: Secondary | ICD-10-CM

## 2017-09-17 ENCOUNTER — Ambulatory Visit
Admission: RE | Admit: 2017-09-17 | Discharge: 2017-09-17 | Disposition: A | Discharge status: Home / Self Care | Payer: BLUE CROSS/BLUE SHIELD | Source: Ambulatory Visit | Attending: Gynecology | Admitting: Gynecology

## 2017-09-17 ENCOUNTER — Other Ambulatory Visit: Payer: Self-pay | Admitting: Gynecology

## 2017-09-17 DIAGNOSIS — Z1231 Encounter for screening mammogram for malignant neoplasm of breast: Secondary | ICD-10-CM

## 2017-10-07 ENCOUNTER — Encounter: Payer: Self-pay | Admitting: Internal Medicine

## 2017-12-01 ENCOUNTER — Ambulatory Visit: Payer: BLUE CROSS/BLUE SHIELD | Admitting: Gynecology

## 2018-01-06 ENCOUNTER — Encounter: Payer: Self-pay | Admitting: Gynecology

## 2018-01-06 ENCOUNTER — Ambulatory Visit (INDEPENDENT_AMBULATORY_CARE_PROVIDER_SITE_OTHER): Payer: BLUE CROSS/BLUE SHIELD | Admitting: Gynecology

## 2018-01-06 VITALS — BP 120/78 | Ht 61.5 in | Wt 147.0 lb

## 2018-01-06 DIAGNOSIS — R6882 Decreased libido: Secondary | ICD-10-CM

## 2018-01-06 DIAGNOSIS — Z01419 Encounter for gynecological examination (general) (routine) without abnormal findings: Secondary | ICD-10-CM | POA: Diagnosis not present

## 2018-01-06 DIAGNOSIS — C530 Malignant neoplasm of endocervix: Secondary | ICD-10-CM | POA: Diagnosis not present

## 2018-01-06 DIAGNOSIS — Z1272 Encounter for screening for malignant neoplasm of vagina: Secondary | ICD-10-CM | POA: Diagnosis not present

## 2018-01-06 DIAGNOSIS — Z853 Personal history of malignant neoplasm of breast: Secondary | ICD-10-CM

## 2018-01-06 DIAGNOSIS — N952 Postmenopausal atrophic vaginitis: Secondary | ICD-10-CM

## 2018-01-06 NOTE — Progress Notes (Signed)
    Robin Curry Dec 02, 1955 [id number]        62 y.o.  X3K4401 for annual gynecologic exam.  Doing well without gynecologic complaints.  Past medical history,surgical history, problem list, medications, allergies, family history and social history were all reviewed and documented as reviewed in the EPIC chart.  ROS:  Performed with pertinent positives and negatives included in the history, assessment and plan.   Additional significant findings : None   Exam: Caryn Bee assistant Vitals:   01/06/18 0857  BP: 120/78  Weight: 147 lb (66.7 kg)  Height: 5' 1.5" (1.562 m)   Body mass index is 27.33 kg/m.  General appearance:  Normal affect, orientation and appearance. Skin: Grossly normal HEENT: Without gross lesions.  No cervical or supraclavicular adenopathy. Thyroid normal.  Lungs:  Clear without wheezing, rales or rhonchi Cardiac: RR, without RMG Abdominal:  Soft, nontender, without masses, guarding, rebound, organomegaly or hernia Breasts:  Examined lying and sitting.  Right without masses, retractions, discharge or axillary adenopathy.  Left status post reconstruction with implant.  No masses or adenopathy Pelvic:  Ext, BUS, Vagina: With atrophic changes.  Pap smear of vaginal cuff done  Adnexa: Without masses or tenderness    Anus and perineum: Normal   Rectovaginal: Normal sphincter tone without palpated masses or tenderness.    Assessment/Plan:  62 y.o. G23P3003 female for annual gynecologic exam.  1. History of adenocarcinoma of the endocervix.  Cone biopsy with microinvasion term pregnancy.  Follow-up cone biopsy afterwards negative.  TAH/BSO with residual adenocarcinoma in situ 1991.  Pap smear done today. 2. Postmenopausal/atrophic genital changes.  No significant hot flushes, night sweats or any vaginal dryness. 3. Decreased libido.  We reviewed the issues of decreased libido and options to include Adyii, and testosterone supplementation.  She does have a  history of breast cancer.  At this point she is not interested in intervention other than behavior modification. 4. History of left breast cancer status post mastectomy and reconstruction.  Mammography 09/2017.  Continue with annual mammography.  Breast exam today NED. 5. DEXA never.  Will schedule in follow-up for this.  Office will call to arrange. 6. Colonoscopy 2014.  Repeat at their recommended interval. 7. Health maintenance.  No routine lab work done as patient does this elsewhere.  Follow-up 1 year, sooner as needed.   Anastasio Auerbach MD, 10:20 AM 01/06/2018   '

## 2018-01-06 NOTE — Patient Instructions (Addendum)
Follow-up in 1 year for annual exam, sooner if any issues  Office will call you to schedule a bone density.

## 2018-01-10 ENCOUNTER — Other Ambulatory Visit: Payer: Self-pay | Admitting: Gynecology

## 2018-01-10 DIAGNOSIS — Z13820 Encounter for screening for osteoporosis: Secondary | ICD-10-CM

## 2018-01-10 LAB — PAP IG W/ RFLX HPV ASCU

## 2018-01-19 ENCOUNTER — Other Ambulatory Visit: Payer: Self-pay | Admitting: Gynecology

## 2018-01-19 ENCOUNTER — Ambulatory Visit (INDEPENDENT_AMBULATORY_CARE_PROVIDER_SITE_OTHER): Payer: BLUE CROSS/BLUE SHIELD

## 2018-01-19 DIAGNOSIS — Z13820 Encounter for screening for osteoporosis: Secondary | ICD-10-CM

## 2018-01-19 DIAGNOSIS — M8589 Other specified disorders of bone density and structure, multiple sites: Secondary | ICD-10-CM

## 2018-01-20 ENCOUNTER — Encounter: Payer: Self-pay | Admitting: Gynecology

## 2018-10-14 ENCOUNTER — Other Ambulatory Visit: Payer: Self-pay | Admitting: Gynecology

## 2018-10-14 DIAGNOSIS — Z1231 Encounter for screening mammogram for malignant neoplasm of breast: Secondary | ICD-10-CM

## 2018-10-19 ENCOUNTER — Ambulatory Visit
Admission: RE | Admit: 2018-10-19 | Discharge: 2018-10-19 | Disposition: A | Discharge status: Home / Self Care | Payer: BLUE CROSS/BLUE SHIELD | Source: Ambulatory Visit | Attending: Gynecology | Admitting: Gynecology

## 2018-10-19 DIAGNOSIS — Z1231 Encounter for screening mammogram for malignant neoplasm of breast: Secondary | ICD-10-CM

## 2019-01-13 ENCOUNTER — Encounter: Payer: BLUE CROSS/BLUE SHIELD | Admitting: Gynecology

## 2019-01-24 ENCOUNTER — Encounter: Payer: BLUE CROSS/BLUE SHIELD | Admitting: Gynecology

## 2019-05-13 IMAGING — MG DIGITAL SCREENING UNILATERAL RIGHT MAMMOGRAM WITH CAD AND TOMO
4 series · 4 of 12 positions shown · non-contrast
Comparison: Previous exam(s).

CLINICAL DATA: Screening.

EXAM:
DIGITAL SCREENING UNILATERAL RIGHT MAMMOGRAM WITH CAD AND TOMO

[R MLO synth-2D]
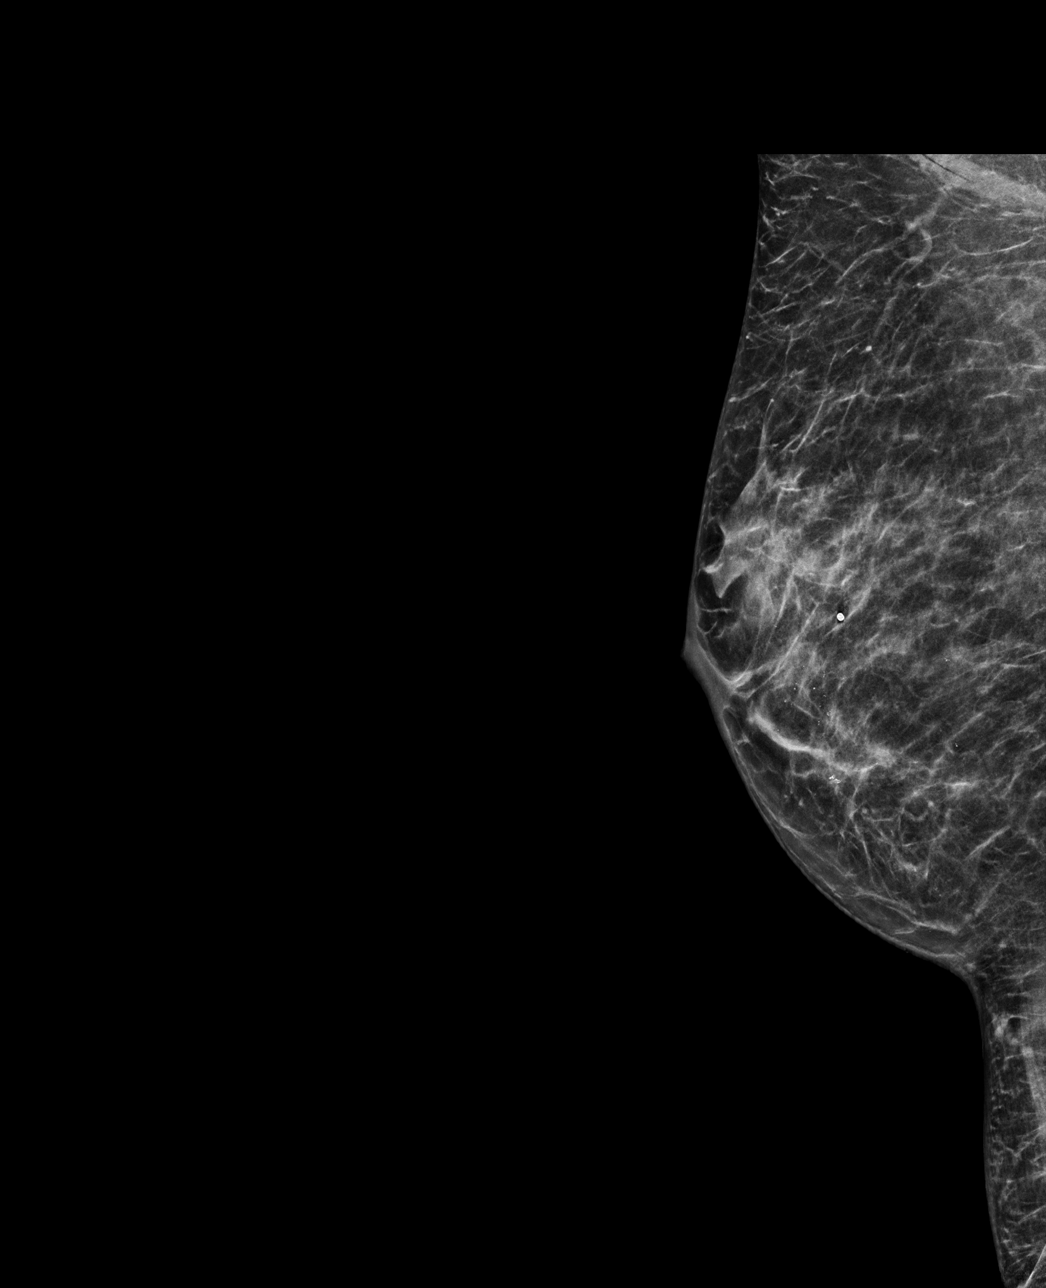

[R CC synth-2D]
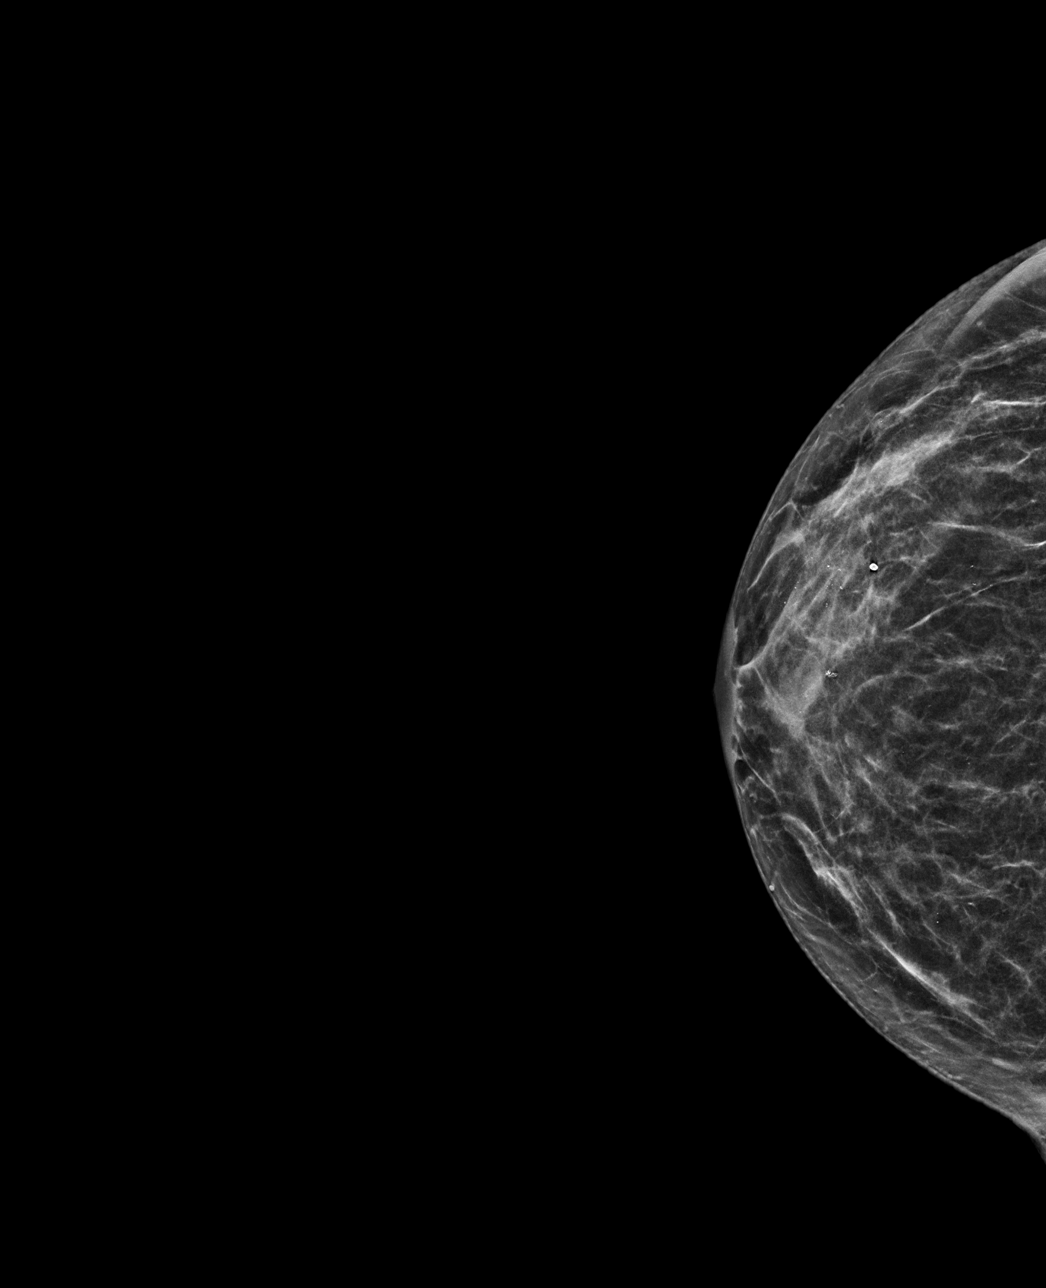

[R CC tomo · tomo slice 27/52.0]
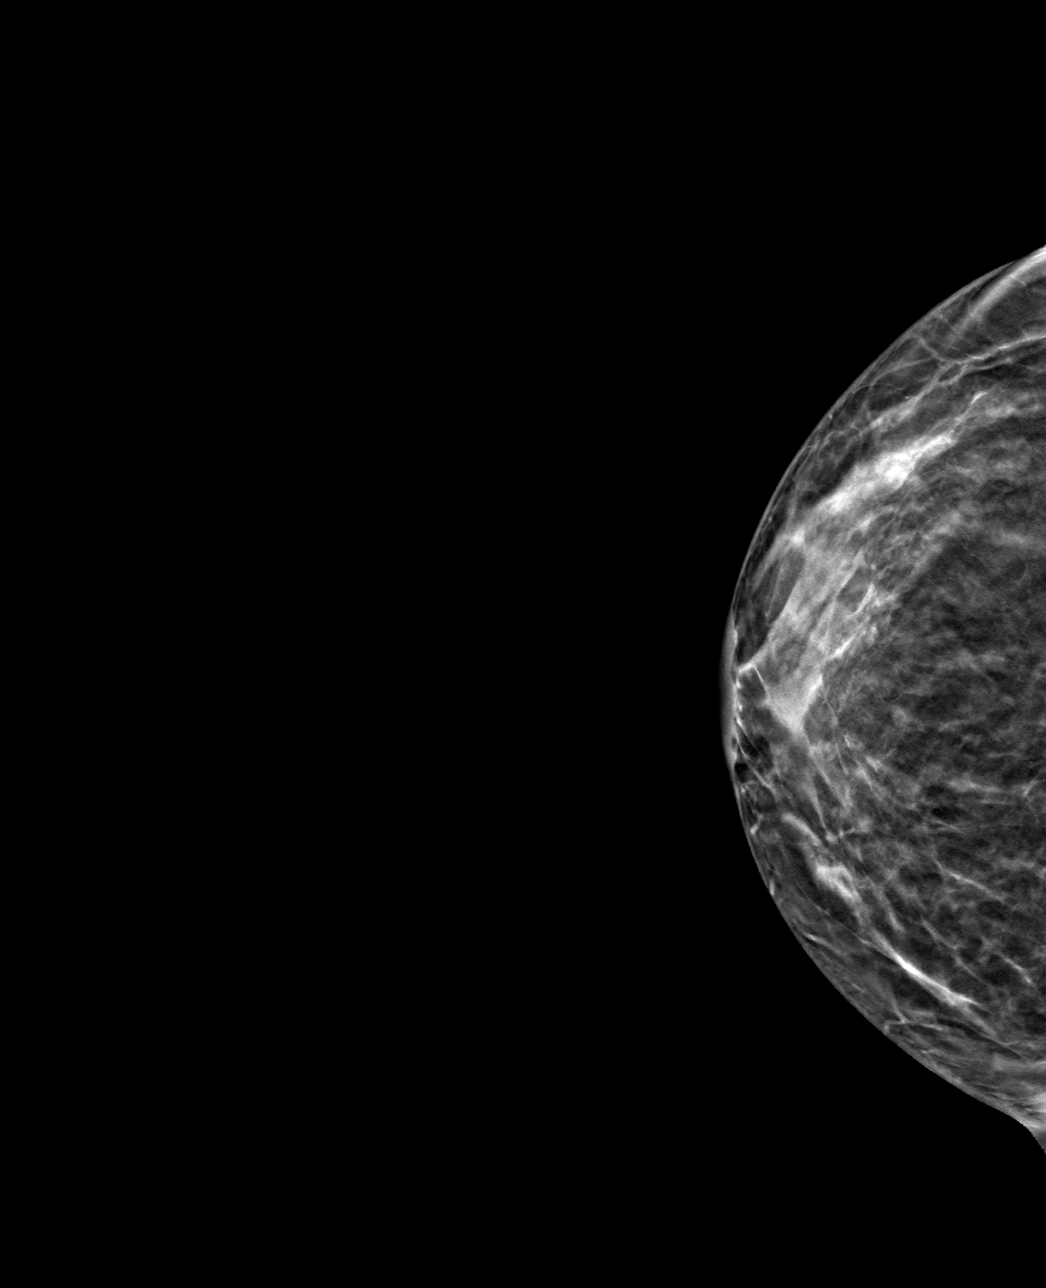

[R MLO tomo · tomo slice 28/55.0]
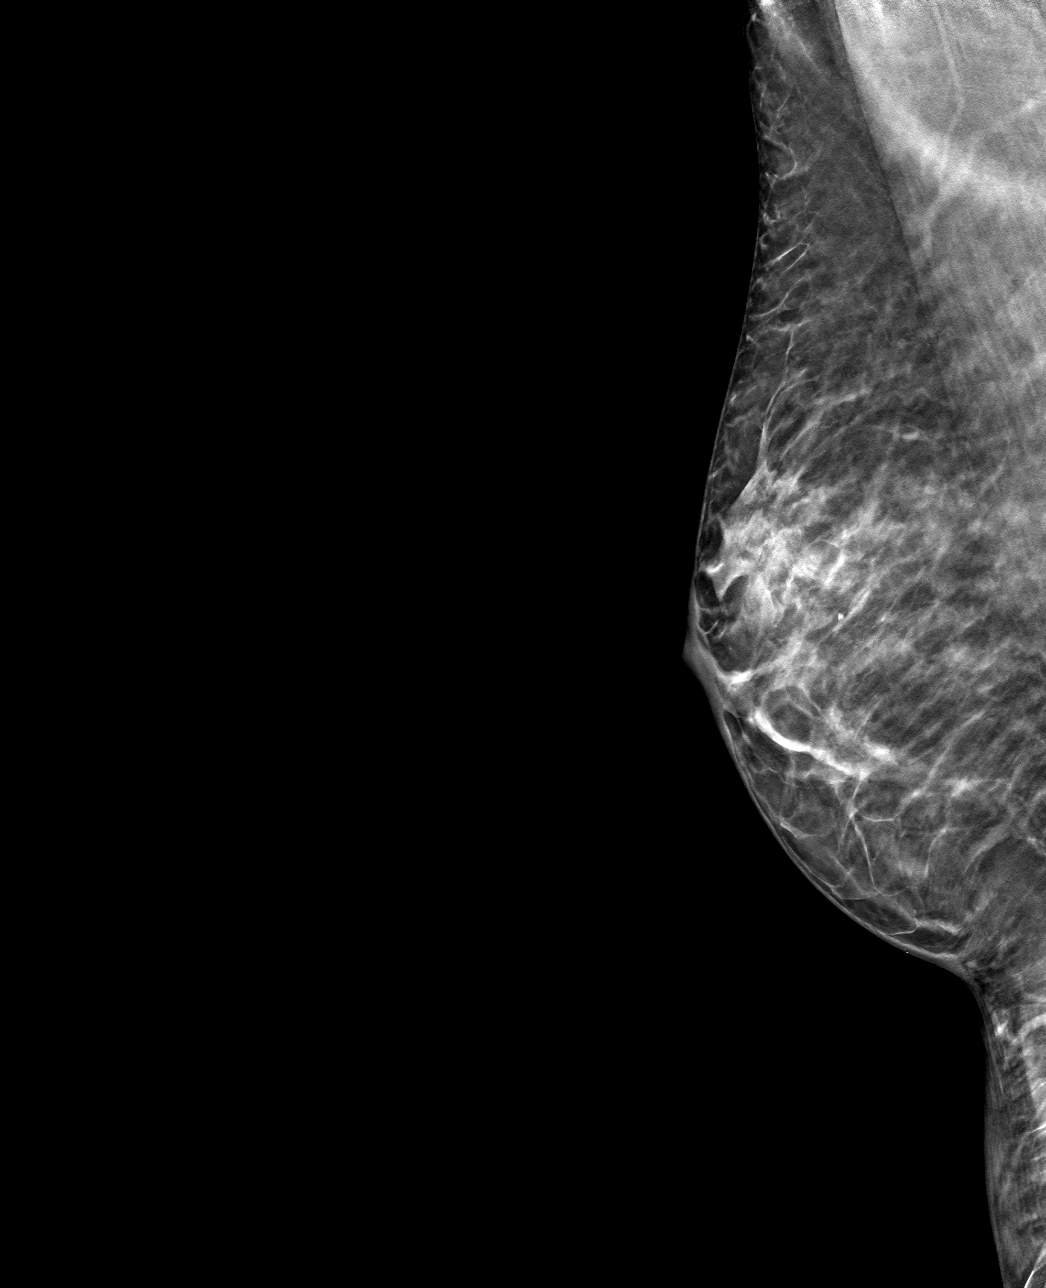

[4 of 12 positions shown; findings below may reference images not displayed]

ACR Breast Density Category c: The breast tissue is heterogeneously
dense, which may obscure small masses.
FINDINGS: The patient has had a left mastectomy. There are no findings
suspicious for malignancy. Images were processed with CAD.
IMPRESSION: No mammographic evidence of malignancy. A result letter of this
screening mammogram will be mailed directly to the patient.

RECOMMENDATION:
Screening mammogram in one year.  (Code:CY-[id number])

BI-RADS CATEGORY  1: Negative.

## 2019-06-06 ENCOUNTER — Encounter: Payer: Self-pay | Admitting: Gynecology

## 2020-01-31 ENCOUNTER — Other Ambulatory Visit: Payer: Self-pay | Admitting: Family Medicine

## 2020-01-31 ENCOUNTER — Encounter: Payer: Self-pay | Admitting: Internal Medicine

## 2020-01-31 DIAGNOSIS — Z1231 Encounter for screening mammogram for malignant neoplasm of breast: Secondary | ICD-10-CM

## 2020-02-09 ENCOUNTER — Other Ambulatory Visit: Payer: Self-pay

## 2020-02-09 ENCOUNTER — Ambulatory Visit
Admission: RE | Admit: 2020-02-09 | Discharge: 2020-02-09 | Disposition: A | Discharge status: Home / Self Care | Payer: BC Managed Care – PPO | Source: Ambulatory Visit | Attending: Family Medicine | Admitting: Family Medicine

## 2020-02-09 DIAGNOSIS — Z1231 Encounter for screening mammogram for malignant neoplasm of breast: Secondary | ICD-10-CM

## 2020-02-12 ENCOUNTER — Other Ambulatory Visit: Payer: Self-pay | Admitting: Family Medicine

## 2020-02-12 DIAGNOSIS — R928 Other abnormal and inconclusive findings on diagnostic imaging of breast: Secondary | ICD-10-CM

## 2020-02-16 ENCOUNTER — Ambulatory Visit
Admission: RE | Admit: 2020-02-16 | Discharge: 2020-02-16 | Disposition: A | Discharge status: Home / Self Care | Payer: BC Managed Care – PPO | Source: Ambulatory Visit | Attending: Family Medicine | Admitting: Family Medicine

## 2020-02-16 ENCOUNTER — Other Ambulatory Visit: Payer: Self-pay

## 2020-02-16 DIAGNOSIS — R928 Other abnormal and inconclusive findings on diagnostic imaging of breast: Secondary | ICD-10-CM

## 2020-03-15 ENCOUNTER — Other Ambulatory Visit: Payer: Self-pay

## 2020-03-15 ENCOUNTER — Ambulatory Visit (AMBULATORY_SURGERY_CENTER): Payer: Self-pay

## 2020-03-15 VITALS — Ht 61.0 in | Wt 149.0 lb

## 2020-03-15 DIAGNOSIS — Z86010 Personal history of colonic polyps: Secondary | ICD-10-CM

## 2020-03-15 MED ORDER — NA SULFATE-K SULFATE-MG SULF 17.5-3.13-1.6 GM/177ML PO SOLN: 1.0000 | Freq: Once | ORAL | 0 refills | Status: AC

## 2020-03-15 NOTE — Progress Notes (Signed)
No egg or soy allergy known to patient  No issues with past sedation with any surgeries  or procedures, no intubation problems  No diet pills per patient No home 02 use per patient  No blood thinners per patient  Pt denies issues with constipation  No A fib or A flutter   Patient has requested a prep without sugar and suprep was sent to the patient's pharmacy and instructions for this given  COVID 19 guidelines implemented in PV today   suprep coupon given to patient at pre visit  COVID vaccine completed on 12/18/2019  Due to the COVID-19 pandemic we are asking patients to follow these guidelines. Please only bring one care partner. Please be aware that your care partner may wait in the car in the parking lot or if they feel like they will be too hot to wait in the car, they may wait in the lobby on the 4th floor. All care partners are required to wear a mask the entire time (we do not have any that we can provide them), they need to practice social distancing, and we will do a Covid check for all patient's and care partners when you arrive. Also we will check their temperature and your temperature. If the care partner waits in their car they need to stay in the parking lot the entire time and we will call them on their cell phone when the patient is ready for discharge so they can bring the car to the front of the building. Also all patient's will need to wear a mask into building.

## 2020-03-19 ENCOUNTER — Encounter: Payer: Self-pay | Admitting: Internal Medicine

## 2020-03-28 ENCOUNTER — Telehealth: Payer: Self-pay | Admitting: Internal Medicine

## 2020-03-28 NOTE — Telephone Encounter (Signed)
OK No charge 

## 2020-03-29 ENCOUNTER — Encounter: Payer: BLUE CROSS/BLUE SHIELD | Admitting: Internal Medicine

## 2020-04-18 ENCOUNTER — Encounter: Payer: Self-pay | Admitting: Certified Registered Nurse Anesthetist

## 2020-04-19 ENCOUNTER — Ambulatory Visit (AMBULATORY_SURGERY_CENTER): Payer: BC Managed Care – PPO | Admitting: Internal Medicine

## 2020-04-19 ENCOUNTER — Other Ambulatory Visit: Payer: Self-pay

## 2020-04-19 ENCOUNTER — Encounter: Payer: Self-pay | Admitting: Internal Medicine

## 2020-04-19 VITALS — BP 106/67 | HR 74 | Temp 97.3°F | Resp 14 | Ht 61.0 in | Wt 149.0 lb

## 2020-04-19 DIAGNOSIS — D120 Benign neoplasm of cecum: Secondary | ICD-10-CM | POA: Diagnosis not present

## 2020-04-19 DIAGNOSIS — D123 Benign neoplasm of transverse colon: Secondary | ICD-10-CM

## 2020-04-19 DIAGNOSIS — D122 Benign neoplasm of ascending colon: Secondary | ICD-10-CM

## 2020-04-19 DIAGNOSIS — D125 Benign neoplasm of sigmoid colon: Secondary | ICD-10-CM | POA: Diagnosis not present

## 2020-04-19 DIAGNOSIS — Z86010 Personal history of colonic polyps: Secondary | ICD-10-CM

## 2020-04-19 MED ORDER — SODIUM CHLORIDE 0.9 % IV SOLN: 500.0000 mL | Freq: Once | INTRAVENOUS | Status: DC

## 2020-04-19 NOTE — Progress Notes (Signed)
Called to room to assist during endoscopic procedure.  Patient ID and intended procedure confirmed with present staff. Received instructions for my participation in the procedure from the performing physician.  

## 2020-04-19 NOTE — Progress Notes (Signed)
Report given to PACU, vss 

## 2020-04-19 NOTE — Patient Instructions (Addendum)
I removed 6 tiny polyps that all look benign.  I will let you know pathology results and when to have another routine colonoscopy by mail and/or My Chart.  I appreciate the opportunity to care for you. Gatha Mayer, MD, [facility]   Handouts on polyps given to you today  Await pathology results on polyps removed    YOU HAD AN ENDOSCOPIC PROCEDURE TODAY AT [place]:   Refer to the procedure report that was given to you for any specific questions about what was found during the examination.  If the procedure report does not answer your questions, please call your gastroenterologist to clarify.  If you requested that your care partner not be given the details of your procedure findings, then the procedure report has been included in a sealed envelope for you to review at your convenience later.  YOU SHOULD EXPECT: Some feelings of bloating in the abdomen. Passage of more gas than usual.  Walking can help get rid of the air that was put into your GI tract during the procedure and reduce the bloating. If you had a lower endoscopy (such as a colonoscopy or flexible sigmoidoscopy) you may notice spotting of blood in your stool or on the toilet paper. If you underwent a bowel prep for your procedure, you may not have a normal bowel movement for a few days.  Please Note:  You might notice some irritation and congestion in your nose or some drainage.  This is from the oxygen used during your procedure.  There is no need for concern and it should clear up in a day or so.  SYMPTOMS TO REPORT IMMEDIATELY:   Following lower endoscopy (colonoscopy or flexible sigmoidoscopy):  Excessive amounts of blood in the stool  Significant tenderness or worsening of abdominal pains  Swelling of the abdomen that is new, acute  Fever of 100F or higher    For urgent or emergent issues, a gastroenterologist can be reached at any hour by calling [phone]. Do not use MyChart messaging for  urgent concerns.    DIET:  We do recommend a small meal at first, but then you may proceed to your regular diet.  Drink plenty of fluids but you should avoid alcoholic beverages for 24 hours.  ACTIVITY:  You should plan to take it easy for the rest of today and you should NOT DRIVE or use heavy machinery until tomorrow (because of the sedation medicines used during the test).    FOLLOW UP: Our staff will call the number listed on your records 48-72 hours following your procedure to check on you and address any questions or concerns that you may have regarding the information given to you following your procedure. If we do not reach you, we will leave a message.  We will attempt to reach you two times.  During this call, we will ask if you have developed any symptoms of COVID 19. If you develop any symptoms (ie: fever, flu-like symptoms, shortness of breath, cough etc.) before then, please call [phone].  If you test positive for Covid 19 in the 2 weeks post procedure, please call and report this information to [place].    If any biopsies were taken you will be contacted by phone or by letter within the next 1-3 weeks.  Please call us at [phone] if you have not heard about the biopsies in 3 weeks.    SIGNATURES/CONFIDENTIALITY: You and/or your care partner have signed paperwork which will be  entered into your electronic medical record.  These signatures attest to the fact that that the information above on your After Visit Summary has been reviewed and is understood.  Full responsibility of the confidentiality of this discharge information lies with you and/or your care-partner. 

## 2020-04-19 NOTE — Progress Notes (Signed)
Pt's states no medical or surgical changes since previsit or office visit. 

## 2020-04-19 NOTE — Op Note (Signed)
[facility] Patient Name: [facility] Procedure Date: 04/19/2020 10:05 AM MRN: [id number] Endoscopist: Gatha Mayer , MD Age: 64 Referring MD:  Date of Birth: April 14, 1956 Gender: Female Account #: [id number] Procedure:                Colonoscopy Indications:              Surveillance: Personal history of adenomatous                            polyps on last colonoscopy > 5 years ago Medicines:                Propofol per Anesthesia, Monitored Anesthesia Care Procedure:                Pre-Anesthesia Assessment:                           - Prior to the procedure, a History and Physical                            was performed, and patient medications and                            allergies were reviewed. The patient's tolerance of                            previous anesthesia was also reviewed. The risks                            and benefits of the procedure and the sedation                            options and risks were discussed with the patient.                            All questions were answered, and informed consent                            was obtained. Prior Anticoagulants: The patient has                            taken no previous anticoagulant or antiplatelet                            agents. ASA Grade Assessment: II - A patient with                            mild systemic disease. After reviewing the risks                            and benefits, the patient was deemed in                            satisfactory condition to undergo the procedure.  After obtaining informed consent, the colonoscope                            was passed under direct vision. Throughout the                            procedure, the patient's blood pressure, pulse, and                            oxygen saturations were monitored continuously. The                            Colonoscope was introduced through the anus and                             advanced to the the cecum, identified by                            appendiceal orifice and ileocecal valve. The                            colonoscopy was performed without difficulty. The                            patient tolerated the procedure well. The quality                            of the bowel preparation was good. The bowel                            preparation used was Miralax via split dose                            instruction. Scope In: 10:09:34 AM Scope Out: 10:29:32 AM Scope Withdrawal Time: 0 hours 16 minutes 37 seconds  Total Procedure Duration: 0 hours 19 minutes 58 seconds  Findings:                 The perianal and digital rectal examinations were                            normal.                           Five sessile polyps were found in the sigmoid                            colon, transverse colon and cecum. The polyps were                            diminutive in size. These polyps were removed with                            a cold snare. Resection and retrieval were  complete. Verification of patient identification                            for the specimen was done. Estimated blood loss was                            minimal.                           A 1 mm polyp was found in the ascending colon. The                            polyp was sessile. The polyp was removed with a                            cold biopsy forceps. Resection and retrieval were                            complete. Verification of patient identification                            for the specimen was done. Estimated blood loss was                            minimal.                           The exam was otherwise without abnormality on                            direct and retroflexion views. Complications:            No immediate complications. Estimated Blood Loss:     Estimated blood loss was minimal. Impression:               - Five diminutive  polyps in the sigmoid colon, in                            the transverse colon and in the cecum, removed with                            a cold snare. Resected and retrieved.                           - One 1 mm polyp in the ascending colon, removed                            with a cold biopsy forceps. Resected and retrieved.                           - The examination was otherwise normal on direct                            and retroflexion views.                           -  Personal history of colonic polyps. Recommendation:           - Patient has a contact number available for                            emergencies. The signs and symptoms of potential                            delayed complications were discussed with the                            patient. Return to normal activities tomorrow.                            Written discharge instructions were provided to the                            patient.                           - Resume previous diet.                           - Continue present medications.                           - Repeat colonoscopy is recommended for                            surveillance. The colonoscopy date will be                            determined after pathology results from today's                            exam become available for review. Gatha Mayer, MD 04/19/2020 10:42:02 AM This report has been signed electronically.

## 2020-04-23 ENCOUNTER — Telehealth: Payer: Self-pay | Admitting: *Deleted

## 2020-04-23 NOTE — Telephone Encounter (Signed)
°  Follow up Call-  Call back number 04/19/2020  Post procedure Call Back phone  # [phone]  Permission to leave phone message Yes  Some recent data might be hidden     Patient questions:  Do you have a fever, pain , or abdominal swelling? No. Pain Score  0 *  Have you tolerated food without any problems? Yes.    Have you been able to return to your normal activities? Yes.    Do you have any questions about your discharge instructions: Diet   No. Medications  No. Follow up visit  No.  Do you have questions or concerns about your Care? No.  Actions: * If pain score is 4 or above: No action needed, pain <4.  1. Have you developed a fever since your procedure? no  2.   Have you had an respiratory symptoms (SOB or cough) since your procedure? no  3.   Have you tested positive for COVID 19 since your procedure no  4.   Have you had any family members/close contacts diagnosed with the COVID 19 since your procedure?  no   If yes to any of these questions please route to Joylene John, RN and Joella Prince, RN

## 2020-04-28 ENCOUNTER — Encounter: Payer: Self-pay | Admitting: Internal Medicine

## 2021-01-07 ENCOUNTER — Other Ambulatory Visit: Payer: Self-pay | Admitting: Family Medicine

## 2021-01-07 DIAGNOSIS — E2839 Other primary ovarian failure: Secondary | ICD-10-CM

## 2021-02-05 ENCOUNTER — Other Ambulatory Visit: Payer: Self-pay | Admitting: Family Medicine

## 2021-02-05 DIAGNOSIS — Z9012 Acquired absence of left breast and nipple: Secondary | ICD-10-CM

## 2021-02-05 DIAGNOSIS — Z1231 Encounter for screening mammogram for malignant neoplasm of breast: Secondary | ICD-10-CM

## 2021-03-18 ENCOUNTER — Other Ambulatory Visit (HOSPITAL_COMMUNITY): Payer: Self-pay | Admitting: Family Medicine

## 2021-03-18 ENCOUNTER — Ambulatory Visit (HOSPITAL_COMMUNITY)
Admission: RE | Admit: 2021-03-18 | Discharge: 2021-03-18 | Disposition: A | Discharge status: Home / Self Care | Payer: 59 | Source: Ambulatory Visit | Attending: Family Medicine | Admitting: Family Medicine

## 2021-03-18 ENCOUNTER — Other Ambulatory Visit: Payer: Self-pay

## 2021-03-18 DIAGNOSIS — M7989 Other specified soft tissue disorders: Secondary | ICD-10-CM

## 2021-03-18 DIAGNOSIS — M79661 Pain in right lower leg: Secondary | ICD-10-CM

## 2021-05-28 ENCOUNTER — Encounter (HOSPITAL_COMMUNITY): Payer: Self-pay

## 2021-05-28 ENCOUNTER — Emergency Department (HOSPITAL_COMMUNITY): Payer: 59

## 2021-05-28 ENCOUNTER — Other Ambulatory Visit: Payer: Self-pay

## 2021-05-28 ENCOUNTER — Emergency Department (HOSPITAL_COMMUNITY)
Admission: EM | Admit: 2021-05-28 | Discharge: 2021-05-28 | Disposition: A | Discharge status: Home / Self Care | Payer: 59 | Attending: Student | Admitting: Student

## 2021-05-28 DIAGNOSIS — Y92007 Garden or yard of unspecified non-institutional (private) residence as the place of occurrence of the external cause: Secondary | ICD-10-CM | POA: Diagnosis not present

## 2021-05-28 DIAGNOSIS — Y9389 Activity, other specified: Secondary | ICD-10-CM | POA: Insufficient documentation

## 2021-05-28 DIAGNOSIS — Z23 Encounter for immunization: Secondary | ICD-10-CM | POA: Diagnosis not present

## 2021-05-28 DIAGNOSIS — S60922A Unspecified superficial injury of left hand, initial encounter: Secondary | ICD-10-CM | POA: Diagnosis present

## 2021-05-28 DIAGNOSIS — Z853 Personal history of malignant neoplasm of breast: Secondary | ICD-10-CM | POA: Insufficient documentation

## 2021-05-28 DIAGNOSIS — W312XXA Contact with powered woodworking and forming machines, initial encounter: Secondary | ICD-10-CM | POA: Insufficient documentation

## 2021-05-28 DIAGNOSIS — S61412A Laceration without foreign body of left hand, initial encounter: Secondary | ICD-10-CM | POA: Insufficient documentation

## 2021-05-28 MED ORDER — TETANUS-DIPHTH-ACELL PERTUSSIS 5-2.5-18.5 LF-MCG/0.5 IM SUSY
0.5000 mL | PREFILLED_SYRINGE | Freq: Once | INTRAMUSCULAR | Status: AC
  Administered 2021-05-28: 0.5 mL via INTRAMUSCULAR
  Filled 2021-05-28: qty 0.5

## 2021-05-28 MED ORDER — LIDOCAINE HCL 2 % IJ SOLN
20.0000 mL | Freq: Once | INTRAMUSCULAR | Status: AC
Start: 2021-05-28 — End: 2021-05-28
  Administered 2021-05-28: 400 mg
  Filled 2021-05-28: qty 20

## 2021-05-28 NOTE — ED Triage Notes (Signed)
Laceration to top of left hand by saw. Bleeding controlled. Patient has complete ROM

## 2021-05-28 NOTE — ED Provider Notes (Signed)
[facility] DEPT Provider Note   CSN: [id number] Arrival date & time: 05/28/21  1605     History Chief Complaint  Patient presents with   Laceration    Robin Curry is a 65 y.o. female who presents to the emergency department for a left hand laceration that occurred earlier today.  She states that she was gardening and accidently cut her hand with a [place] saw.  She is right-hand dominant.  She went to a walk-in clinic attached to her primary care provider who sent her to the emergency department secondary to possible tendon involvement.  She does report mild weakness with extension of the left index finger but has no numbness.  She had her tetanus updated prior to arrival.  Denies any other injury. Tetanus was updated PTA.   The history is provided by the patient. No language interpreter was used.  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, high-grade DCIS    Personal history of adenomatous colonic polyps     Patient Active Problem List   Diagnosis Date Noted   Personal history of adenomatous colonic polyps 09/19/2012    Past Surgical History:  Procedure Laterality Date   ABDOMINAL HYSTERECTOMY     BREAST LUMPECTOMY  2004   COLONOSCOPY     2014   MASTECTOMY     left 2010 with reconstruction   REDUCTION MAMMAPLASTY Right    breast lift     OB History     Gravida  3   Para  3   Term  3   Preterm      AB      Living  3      SAB      IAB      Ectopic      Multiple      Live Births              Family History  Problem Relation Age of Onset   Hypertension Mother    Cancer Father        Prostate   Heart failure Father    Diabetes Sister    Cancer Brother        Glioblastoma   Diabetes Brother    Stroke Brother    Diabetes Brother    Colon cancer Neg Hx    Rectal cancer Neg Hx    Stomach cancer Neg Hx     Esophageal cancer Neg Hx    Colon polyps Neg Hx     Social History   Tobacco Use   Smoking status: Never   Smokeless tobacco: Never  Vaping Use   Vaping Use: Never used  Substance Use Topics   Alcohol use: Yes    Alcohol/week: 5.0 standard drinks    Types: 5 Glasses of wine per week    Comment: every day use -   Drug use: No    Home Medications Prior to Admission medications   Medication Sig Start Date End Date Taking? Authorizing Provider  magnesium chloride (SLOW-MAG) 64 MG TBEC SR tablet Take 194 tablets by mouth daily.    [provider]  valACYclovir (VALTREX) 1000 MG tablet Take 1,000 mg by mouth 2 (two) times daily as needed. 10/19/19   [provider]    Allergies    Patient has no known allergies.  Review of Systems  Review of Systems  All other systems reviewed and are negative.  Physical Exam Updated Vital Signs BP 128/88   Pulse 74   Temp 98 F (36.7 C)   Resp 20   Ht 5\' 1"  (1.549 m)   Wt 67 kg   SpO2 99%   BMI 27.91 kg/m   Physical Exam Vitals reviewed.  Constitutional:      Appearance: Normal appearance.  HENT:     Head: Normocephalic and atraumatic.  Eyes:     General:        Right eye: No discharge.        Left eye: No discharge.     Conjunctiva/sclera: Conjunctivae normal.  Pulmonary:     Effort: Pulmonary effort is normal.  Musculoskeletal:     Comments: 5/5 strength at the left second PIP and DIP joints.  3/5 weakness at the left second MCP.  Obvious tendon involvement on exam.  Skin:    General: Skin is warm and dry.     Findings: No rash.  Neurological:     General: No focal deficit present.     Mental Status: She is alert.  Psychiatric:        Mood and Affect: Mood normal.        Behavior: Behavior normal.     ED Results / Procedures / Treatments   Labs (all labs ordered are listed, but only abnormal results are displayed) Labs Reviewed - No data to display  EKG None  Radiology DG Hand  Complete Left  Result Date: 05/28/2021 CLINICAL DATA:  Hand laceration from saw, initial encounter EXAM: LEFT HAND - COMPLETE 3+ VIEW COMPARISON:  None. FINDINGS: Soft tissue laceration is noted at the base of the second digit. No radiopaque foreign body is seen. No acute fracture or dislocation is noted. IMPRESSION: Soft tissue injury without acute bony abnormality. Electronically Signed   By: Inez Catalina M.D.   On: 05/28/2021 18:02    Procedures .Marland KitchenLaceration Repair  Date/Time: 05/28/2021 9:14 PM Performed by: Hendricks Limes, PA-C Authorized by: Hendricks Limes, PA-C   Consent:    Consent obtained:  Verbal   Consent given by:  Patient   Risks, benefits, and alternatives were discussed: yes     Risks discussed:  Infection, pain and poor cosmetic result Universal protocol:    Procedure explained and questions answered to patient or proxy's satisfaction: yes     Relevant documents present and verified: yes     Test results available: yes     Imaging studies available: yes     Required blood products, implants, devices, and special equipment available: no     Site/side marked: no     Immediately prior to procedure, a time out was called: no     Patient identity confirmed:  Verbally with patient and arm band Anesthesia:    Anesthesia method:  Local infiltration   Local anesthetic:  Lidocaine 2% w/o epi Laceration details:    Location:  Hand   Hand location:  L hand, dorsum   Length (cm):  4 Pre-procedure details:    Preparation:  Patient was prepped and draped in usual sterile fashion Exploration:    Hemostasis achieved with:  Direct pressure   Imaging obtained: x-ray     Imaging outcome: foreign body not noted     Wound extent: tendon damage     Tendon damage location:  Upper extremity   Upper extremity tendon damage location:  Finger extensor   Finger extensor tendon:  Extensor indicis   Tendon damage extent:  Partial transection   Tendon repair plan:  Refer for  evaluation   Contaminated: no   Treatment:    Area cleansed with:  Saline   Amount of cleaning:  Standard   Irrigation solution:  Sterile saline   Irrigation volume:  500 mL   Irrigation method:  Pressure wash   Debridement:  None   Scar revision: no   Skin repair:    Repair method:  Sutures   Suture size:  4-0   Suture material:  Prolene   Suture technique:  Simple interrupted   Number of sutures:  2 Approximation:    Approximation:  Loose Repair type:    Repair type:  Simple Post-procedure details:    Dressing:  Splint for protection   Procedure completion:  Tolerated well, no immediate complications   Medications Ordered in ED Medications  Tdap (BOOSTRIX) injection 0.5 mL (0.5 mLs Intramuscular Given 05/28/21 1711)  lidocaine (XYLOCAINE) 2 % (with pres) injection 400 mg (400 mg Infiltration Given 05/28/21 2009)    ED Course  I have reviewed the triage vital signs and the nursing notes.  Pertinent labs & imaging results that were available during my care of the patient were reviewed by me and considered in my medical decision making (see chart for details).  Clinical Course as of 05/28/21 2125  Wed May 28, 2021  1923 Spoke with Dr. Tempie Donning with orthopedic hand surgery.  He recommends thorough washout and loose approximation.  After closure he recommends splinting the finger in extension and have her follow-up in the office tomorrow or Friday. [CF]    Clinical Course User Index [CF] Cherrie Gauze   MDM Rules/Calculators/A&P                          Robin Curry is a 65 y.o. female who presents the emergency department today for further evaluation of a left hand laceration.  X-ray was negative for any fracture or foreign body.  There is no obvious foreign body on visual examination.  I closed the wound in the department today per orthopedic hand surgery request.  Please see procedure note above.  I instructed the patient multiple times to follow-up with  hand surgery within the next 48 hours.  I expressed the complications of not following up with hand surgery.  Strict return precautions given.  She is stable and safe for discharge.  Final Clinical Impression(s) / ED Diagnoses Final diagnoses:  Laceration of left hand, foreign body presence unspecified, initial encounter    Rx / DC Orders ED Discharge Orders     None        Cherrie Gauze 05/28/21 2125    Teressa Lower, MD 05/29/21 0000

## 2021-05-28 NOTE — ED Provider Notes (Signed)
Emergency Medicine Provider Triage Evaluation Note  Robin Curry , a 65 y.o. female  was evaluated in triage.  Pt complains of laceration of the dorsum of the left hand.  Patient states she was using a gardening fall earlier today, this all slipped, and cut the left hand.  She is right-hand dominant.  She went to a walk-in clinic just prior to arrival and due to possible extensor tendon involvement they recommended that she come to the emergency department for further evaluation.  She reports mild weakness with extension of the left index finger but no numbness.  No other complaints.  She is unsure of the timing of her last Tdap.  Physical Exam  BP (!) 141/98   Pulse 87   Temp 98 F (36.7 C)   Resp 20   Ht 5\' 1"  (1.549 m)   Wt 67 kg   SpO2 100%   BMI 27.91 kg/m  Gen:   Awake, no distress   Resp:  Normal effort  MSK:   Moves extremities without difficulty  Other:    Medical Decision Making  Medically screening exam initiated at 4:42 PM.  Appropriate orders placed.  Robin Curry was informed that the remainder of the evaluation will be completed by another provider, this initial triage assessment does not replace that evaluation, and the importance of remaining in the ED until their evaluation is complete.   Rayna Sexton, PA-C 05/28/21 1644    Tegeler, Gwenyth Allegra, MD 05/28/21 [phone]

## 2021-05-28 NOTE — Discharge Instructions (Addendum)
You were seen and evaluated in the emergency department today for a hand laceration.  As we discussed, it is very important that you follow-up with orthopedic hand surgery within the next 48 hours.  Until then, please keep the wound dry and clean.  Apply Neosporin to the wound periodically throughout the day.  Tylenol/ibuprofen as needed for pain.

## 2021-05-30 ENCOUNTER — Encounter: Payer: Self-pay | Admitting: Orthopedic Surgery

## 2021-05-30 ENCOUNTER — Ambulatory Visit (INDEPENDENT_AMBULATORY_CARE_PROVIDER_SITE_OTHER): Payer: 59 | Admitting: Orthopedic Surgery

## 2021-05-30 DIAGNOSIS — S66321A Laceration of extensor muscle, fascia and tendon of left index finger at wrist and hand level, initial encounter: Secondary | ICD-10-CM | POA: Diagnosis not present

## 2021-05-30 NOTE — Progress Notes (Signed)
Office Visit Note   Patient: Robin Curry           Date of Birth: 1956-05-14           MRN: [id number] Visit Date: 05/30/2021              Requested by: Harlan Stains, MD [address] [place],  [place] [address] PCP: Harlan Stains, MD   Assessment & Plan: Visit Diagnoses:  1. Laceration of extensor structure of left index finger at hand level     Plan: We discussed the diagnosis, prognosis, and both conservative and operative treatment options for her left index finger extensor laceration.  We discussed that she likely injured the [employer] [facility] to the index finger and has an intact EIP tendon.  She has reasonable function of his finger with the intact extensor but is weaker than her other digits and contralateral index.   After our discussion, the patient has elected to proceed with exploration with tendon repair.  We reviewed the benefits of surgery and the potential risks including, but not limited to, persistent symptoms, infection, damage to nearby nerves and blood vessels, delayed wound healing.  She feels like she still has acceptable function of the index finger despite the injury.  She will call and cancel the surgery if she changes her mind.   All patient concerns and questions were addressed.  A surgical date will be confirmed with the patient.    Follow-Up Instructions: No follow-ups on file.   Orders:  No orders of the defined types were placed in this encounter.  No orders of the defined types were placed in this encounter.     Procedures: No procedures performed   Clinical Data: No additional findings.   Subjective: Chief Complaint  Patient presents with   Left Hand - New Patient (Initial Visit)    This is a 65 year old right-hand-dominant female who presents for ER follow-up after laceration to her left index finger at the MCP joint.  She was using a [place] saw when the saw slipped and cut her finger.  She notes that she saw a structure  in the wound that appeared to be extensor tendon.  She has no pain today.  She is not take any medications for the pain.  She is not in a Splint.  She notes that she is able to extend her index finger from an extended position and independently of the other digits.  She does note that this is weaker than it was before.  She denies any numbness or paresthesias.   Review of Systems  Constitutional: Negative.   Respiratory: Negative.    Cardiovascular: Negative.   Skin: Negative.   Neurological: Negative.     Objective: Vital Signs: BP 120/74 (BP Location: Right Arm, Patient Position: Sitting, Cuff Size: Normal)   Pulse 87   Ht 5\' 1"  (1.549 m)   Wt 153 lb (69.4 kg)   SpO2 97%   BMI 28.91 kg/m   Physical Exam Cardiovascular:     Rate and Rhythm: Normal rate.     Pulses: Normal pulses.  Pulmonary:     Effort: Pulmonary effort is normal.  Skin:    General: Skin is warm and dry.     Capillary Refill: Capillary refill takes less than 2 seconds.  Neurological:     Mental Status: She is alert.    Left Hand Exam   Tenderness  Left hand tenderness location: TTP around longitudinal incision at radial border of  dorsal MCPJ.   Range of Motion  The patient has normal left wrist ROM.  Other  Sensation: normal Pulse: present  Comments:  Able to extend the index finger independently and from a flexed position but is weaker than her other digits or contralateral side.      Specialty Comments:  No specialty comments available.  Imaging: No results found.   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, high-grade DCIS    Personal history of adenomatous colonic polyps     Family History   Problem Relation Age of Onset   Hypertension Mother    Cancer Father        Prostate   Heart failure Father    Diabetes Sister    Cancer Brother        Glioblastoma   Diabetes Brother    Stroke Brother    Diabetes Brother    Colon cancer Neg Hx    Rectal cancer Neg Hx    Stomach cancer Neg Hx    Esophageal cancer Neg Hx    Colon polyps Neg Hx     Past Surgical History:  Procedure Laterality Date   ABDOMINAL HYSTERECTOMY     BREAST LUMPECTOMY  2004   COLONOSCOPY     2014   MASTECTOMY     left 2010 with reconstruction   REDUCTION MAMMAPLASTY Right    breast lift   Social History   Occupational History   Not on file  Tobacco Use   Smoking status: Never   Smokeless tobacco: Never  Vaping Use   Vaping Use: Never used  Substance and Sexual Activity   Alcohol use: Yes    Alcohol/week: 5.0 standard drinks    Types: 5 Glasses of wine per week    Comment: every day use -   Drug use: No   Sexual activity: Yes    Birth control/protection: Surgical    Comment: HYST-1st intercourse 65 yo-More than 5 partners

## 2021-07-23 ENCOUNTER — Other Ambulatory Visit: Payer: BC Managed Care – PPO

## 2021-07-23 ENCOUNTER — Ambulatory Visit: Payer: BC Managed Care – PPO

## 2021-11-28 ENCOUNTER — Other Ambulatory Visit: Payer: Self-pay | Admitting: Family Medicine

## 2021-11-28 DIAGNOSIS — E2839 Other primary ovarian failure: Secondary | ICD-10-CM

## 2021-11-28 DIAGNOSIS — Z1231 Encounter for screening mammogram for malignant neoplasm of breast: Secondary | ICD-10-CM

## 2021-11-28 DIAGNOSIS — Z9012 Acquired absence of left breast and nipple: Secondary | ICD-10-CM

## 2021-12-03 ENCOUNTER — Ambulatory Visit: Payer: 59

## 2021-12-03 ENCOUNTER — Other Ambulatory Visit: Payer: 59

## 2022-01-06 DIAGNOSIS — H5203 Hypermetropia, bilateral: Secondary | ICD-10-CM | POA: Diagnosis not present

## 2022-05-18 ENCOUNTER — Ambulatory Visit
Admission: RE | Admit: 2022-05-18 | Discharge: 2022-05-18 | Disposition: A | Discharge status: Home / Self Care | Payer: Medicare HMO | Source: Ambulatory Visit | Attending: Family Medicine | Admitting: Family Medicine

## 2022-05-18 DIAGNOSIS — Z9012 Acquired absence of left breast and nipple: Secondary | ICD-10-CM

## 2022-05-18 DIAGNOSIS — E2839 Other primary ovarian failure: Secondary | ICD-10-CM

## 2022-05-18 DIAGNOSIS — Z780 Asymptomatic menopausal state: Secondary | ICD-10-CM | POA: Diagnosis not present

## 2022-05-18 DIAGNOSIS — Z1231 Encounter for screening mammogram for malignant neoplasm of breast: Secondary | ICD-10-CM | POA: Diagnosis not present

## 2022-05-18 DIAGNOSIS — M8589 Other specified disorders of bone density and structure, multiple sites: Secondary | ICD-10-CM | POA: Diagnosis not present

## 2022-11-02 DIAGNOSIS — Z23 Encounter for immunization: Secondary | ICD-10-CM | POA: Diagnosis not present

## 2022-11-02 DIAGNOSIS — E669 Obesity, unspecified: Secondary | ICD-10-CM | POA: Diagnosis not present

## 2022-11-02 DIAGNOSIS — M533 Sacrococcygeal disorders, not elsewhere classified: Secondary | ICD-10-CM | POA: Diagnosis not present

## 2022-11-02 DIAGNOSIS — E559 Vitamin D deficiency, unspecified: Secondary | ICD-10-CM | POA: Diagnosis not present

## 2022-11-02 DIAGNOSIS — E785 Hyperlipidemia, unspecified: Secondary | ICD-10-CM | POA: Diagnosis not present

## 2022-11-02 DIAGNOSIS — Z8619 Personal history of other infectious and parasitic diseases: Secondary | ICD-10-CM | POA: Diagnosis not present

## 2022-11-02 DIAGNOSIS — Z0000 Encounter for general adult medical examination without abnormal findings: Secondary | ICD-10-CM | POA: Diagnosis not present

## 2022-11-02 DIAGNOSIS — M8588 Other specified disorders of bone density and structure, other site: Secondary | ICD-10-CM | POA: Diagnosis not present

## 2023-07-19 ENCOUNTER — Encounter: Payer: Self-pay | Admitting: Internal Medicine

## 2023-09-23 ENCOUNTER — Encounter: Payer: Self-pay | Admitting: Internal Medicine

## 2023-10-20 ENCOUNTER — Ambulatory Visit (AMBULATORY_SURGERY_CENTER): Payer: Medicare HMO

## 2023-10-20 VITALS — Ht 61.0 in | Wt 159.0 lb

## 2023-10-20 DIAGNOSIS — Z860100 Personal history of colon polyps, unspecified: Secondary | ICD-10-CM

## 2023-10-20 NOTE — Progress Notes (Signed)

## 2023-11-09 ENCOUNTER — Encounter: Payer: Self-pay | Admitting: Internal Medicine

## 2023-11-09 NOTE — Progress Notes (Unsigned)
 [facility] History and Physical   Primary Care Physician:  Laurann Montana, MD   Reason for Procedure:  History of colon polyps  Plan:    Colonoscopy     HPI: Robin Curry is a 68 y.o. female with a history of colon polyps as outlined below presenting for a surveillance colonoscopy exam.  04/2009 7 mm tubulovillous adenoma removed 09/19/2012 2mm adenoma 04/2020- 6 diminutive adenomas recall 2024 Past Medical History:  Diagnosis Date   Adenocarcinoma of cervix ([facility]) 1991   TAH   Breast cancer ([facility])    2004 and 2010, high-grade DCIS    Personal history of adenomatous colonic polyps     Past Surgical History:  Procedure Laterality Date   ABDOMINAL HYSTERECTOMY     BREAST LUMPECTOMY  2004   COLONOSCOPY     2014   MASTECTOMY     left 2010 with reconstruction   REDUCTION MAMMAPLASTY Right    breast lift    Prior to Admission medications   Medication Sig Start Date End Date Taking? Authorizing Provider  Cod Liver Oil OIL Orally once a day    [provider]  valACYclovir (VALTREX) 1000 MG tablet Take 1,000 mg by mouth 2 (two) times daily as needed. 10/19/19   [provider]    Current Outpatient Medications  Medication Sig Dispense Refill   Cod Liver Oil OIL Orally once a day     valACYclovir (VALTREX) 1000 MG tablet Take 1,000 mg by mouth 2 (two) times daily as needed.     No current facility-administered medications for this visit.    Allergies as of 11/10/2023   (No Known Allergies)    Family History  Problem Relation Age of Onset   Hypertension Mother    Cancer Father        Prostate   Heart failure Father    Diabetes Sister    Cancer Brother        Glioblastoma   Diabetes Brother    Stroke Brother    Diabetes Brother    Colon cancer Neg Hx    Rectal cancer Neg Hx    Stomach cancer Neg Hx    Esophageal cancer Neg Hx    Colon polyps Neg Hx    Breast cancer Neg Hx     Social History   Socioeconomic  History   Marital status: Married    Spouse name: Not on file   Number of children: Not on file   Years of education: Not on file   Highest education level: Not on file  Occupational History   Not on file  Tobacco Use   Smoking status: Never   Smokeless tobacco: Never  Vaping Use   Vaping status: Never Used  Substance and Sexual Activity   Alcohol use: Yes    Alcohol/week: 5.0 standard drinks of alcohol    Types: 5 Glasses of wine per week    Comment: every day use -   Drug use: No   Sexual activity: Yes    Birth control/protection: Surgical    Comment: HYST-1st intercourse 68 yo-More than 5 partners  Other Topics Concern   Not on file  Social History Narrative   Not on file   Social Drivers of Health   Financial Resource Strain: Not on file  Food Insecurity: Not on file  Transportation Needs: Not on file  Physical Activity: Not on file  Stress: Not on file  Social Connections: Not on file  Intimate Partner Violence:  Not on file    Review of Systems: Positive for *** All other review of systems negative except as mentioned in the HPI.  Physical Exam: Vital signs There were no vitals taken for this visit.  General:   Alert,  Well-developed, well-nourished, pleasant and cooperative in NAD Lungs:  Clear throughout to auscultation.   Heart:  Regular rate and rhythm; no murmurs, clicks, rubs,  or gallops. Abdomen:  Soft, nontender and nondistended. Normal bowel sounds.   Neuro/Psych:  Alert and cooperative. Normal mood and affect. A and O x 3   @Loryn Haacke  Sena Slate, MD, [facility] [phone] (pager) 11/09/2023 5:43 PM@

## 2023-11-10 ENCOUNTER — Encounter: Payer: Self-pay | Admitting: Internal Medicine

## 2023-11-10 ENCOUNTER — Ambulatory Visit: Payer: Medicare HMO | Admitting: Internal Medicine

## 2023-11-10 VITALS — BP 157/70 | HR 69 | Temp 98.3°F | Resp 16 | Ht 61.0 in | Wt 159.0 lb

## 2023-11-10 DIAGNOSIS — D123 Benign neoplasm of transverse colon: Secondary | ICD-10-CM

## 2023-11-10 DIAGNOSIS — Z860100 Personal history of colon polyps, unspecified: Secondary | ICD-10-CM

## 2023-11-10 DIAGNOSIS — Z860101 Personal history of adenomatous and serrated colon polyps: Secondary | ICD-10-CM

## 2023-11-10 DIAGNOSIS — K5730 Diverticulosis of large intestine without perforation or abscess without bleeding: Secondary | ICD-10-CM | POA: Diagnosis not present

## 2023-11-10 DIAGNOSIS — Z1211 Encounter for screening for malignant neoplasm of colon: Secondary | ICD-10-CM | POA: Diagnosis not present

## 2023-11-10 MED ORDER — SODIUM CHLORIDE 0.9 % IV SOLN: 500.0000 mL | INTRAVENOUS | Status: DC

## 2023-11-10 NOTE — Op Note (Signed)
  [facility] Patient Name: Robin Curry Procedure Date: 11/10/2023 9:27 AM MRN: [id number] Endoscopist: Iva Boop , MD, [id number] Age: 68 Referring MD:  Date of Birth: 1956-02-24 Gender: Female Account #: [id number] Procedure:                Colonoscopy Indications:              Surveillance: Personal history of adenomatous                            polyps on last colonoscopy > 3 years ago, Last                            colonoscopy: 2021 Medicines:                Monitored Anesthesia Care Procedure:                Pre-Anesthesia Assessment:                           - Prior to the procedure, a History and Physical                            was performed, and patient medications and                            allergies were reviewed. The patient's tolerance of                            previous anesthesia was also reviewed. The risks                            and benefits of the procedure and the sedation                            options and risks were discussed with the patient.                            All questions were answered, and informed consent                            was obtained. Prior Anticoagulants: The patient has                            taken no anticoagulant or antiplatelet agents. ASA                            Grade Assessment: II - A patient with mild systemic                            disease. After reviewing the risks and benefits,                            the patient was deemed in satisfactory condition to  undergo the procedure.                           After obtaining informed consent, the colonoscope                            was passed under direct vision. Throughout the                            procedure, the patient's blood pressure, pulse, and                            oxygen saturations were monitored continuously. The                            Olympus Scope [id number] was introduced  through the                            anus and advanced to the the cecum, identified by                            appendiceal orifice and ileocecal valve. The                            colonoscopy was performed without difficulty. The                            patient tolerated the procedure well. The quality                            of the bowel preparation was good. The ileocecal                            valve, appendiceal orifice, and rectum were                            photographed. The bowel preparation used was                            Miralax via split dose instruction. Scope In: 9:31:11 AM Scope Out: 9:41:55 AM Scope Withdrawal Time: 0 hours 8 minutes 10 seconds  Total Procedure Duration: 0 hours 10 minutes 44 seconds  Findings:                 The perianal and digital rectal examinations were                            normal.                           A diminutive polyp was found in the transverse                            colon. The polyp was sessile. The polyp was removed  with a cold snare. Resection and retrieval were                            complete. Verification of patient identification                            for the specimen was done. Estimated blood loss was                            minimal.                           Multiple small-mouthed diverticula were found in                            the sigmoid colon.                           The exam was otherwise without abnormality on                            direct and retroflexion views. Complications:            No immediate complications. Estimated Blood Loss:     Estimated blood loss was minimal. Impression:               - One diminutive polyp in the transverse colon,                            removed with a cold snare. Resected and retrieved.                           - Diverticulosis in the sigmoid colon.                           - The examination was otherwise  normal on direct                            and retroflexion views.                           - Personal history of colonic polyps. 04/2009 7 mm                            tubulovillous adenoma removed                           09/19/2012 2mm adenoma                           04/2020- 6 diminutive adenomas Recommendation:           - Patient has a contact number available for                            emergencies. The signs and symptoms of potential  delayed complications were discussed with the                            patient. Return to normal activities tomorrow.                            Written discharge instructions were provided to the                            patient.                           - Resume previous diet.                           - Continue present medications.                           - Await pathology results.                           - Repeat colonoscopy in 5 years for surveillance. Iva Boop, MD 11/10/2023 9:51:54 AM This report has been signed electronically.

## 2023-11-10 NOTE — Progress Notes (Signed)
 Pt's states no medical or surgical changes since previsit or office visit.

## 2023-11-10 NOTE — Patient Instructions (Addendum)
 Handouts provided about diverticulosis and polyps.  Resume previous diet.  Continue present medications.  Await pathology results.  Repeat colonoscopy in 5 years for surveillance.   YOU HAD AN ENDOSCOPIC PROCEDURE TODAY AT THE [facility]:   Refer to the procedure report that was given to you for any specific questions about what was found during the examination.  If the procedure report does not answer your questions, please call your gastroenterologist to clarify.  If you requested that your care partner not be given the details of your procedure findings, then the procedure report has been included in a sealed envelope for you to review at your convenience later.  YOU SHOULD EXPECT: Some feelings of bloating in the abdomen. Passage of more gas than usual.  Walking can help get rid of the air that was put into your GI tract during the procedure and reduce the bloating. If you had a lower endoscopy (such as a colonoscopy or flexible sigmoidoscopy) you may notice spotting of blood in your stool or on the toilet paper. If you underwent a bowel prep for your procedure, you may not have a normal bowel movement for a few days.  Please Note:  You might notice some irritation and congestion in your nose or some drainage.  This is from the oxygen used during your procedure.  There is no need for concern and it should clear up in a day or so.  SYMPTOMS TO REPORT IMMEDIATELY:  Following lower endoscopy (colonoscopy or flexible sigmoidoscopy):  Excessive amounts of blood in the stool  Significant tenderness or worsening of abdominal pains  Swelling of the abdomen that is new, acute  Fever of 100F or higher  For urgent or emergent issues, a gastroenterologist can be reached at any hour by calling [phone]. Do not use MyChart messaging for urgent concerns.    DIET:  We do recommend a small meal at first, but then you may proceed to your regular diet.  Drink plenty of fluids but  you should avoid alcoholic beverages for 24 hours.  ACTIVITY:  You should plan to take it easy for the rest of today and you should NOT DRIVE or use heavy machinery until tomorrow (because of the sedation medicines used during the test).    FOLLOW UP: Our staff will call the number listed on your records the next business day following your procedure.  We will call around 7:15- 8:00 am to check on you and address any questions or concerns that you may have regarding the information given to you following your procedure. If we do not reach you, we will leave a message.     If any biopsies were taken you will be contacted by phone or by letter within the next 1-3 weeks.  Please call us at [phone] if you have not heard about the biopsies in 3 weeks.    SIGNATURES/CONFIDENTIALITY: You and/or your care partner have signed paperwork which will be entered into your electronic medical record.  These signatures attest to the fact that that the information above on your After Visit Summary has been reviewed and is understood.  Full responsibility of the confidentiality of this discharge information lies with you and/or your care-partner.One tiny polyp found and removed.  You have diverticulosis - thickened muscle rings and pouches in the colon wall. Please read the handout about this condition.  I appreciate the opportunity to care for you. Iva Boop, MD, [facility]

## 2023-11-10 NOTE — Progress Notes (Signed)
 Pt A/O x 3, gd SR's, pleased with anesthesia, report to RN

## 2023-11-10 NOTE — Progress Notes (Signed)
 Called to room to assist during endoscopic procedure.  Patient ID and intended procedure confirmed with present staff. Received instructions for my participation in the procedure from the performing physician.

## 2023-11-11 ENCOUNTER — Telehealth: Payer: Self-pay

## 2023-11-11 NOTE — Telephone Encounter (Signed)
  Follow up Call-     11/10/2023    8:49 AM  Call back number  Post procedure Call Back phone  # [phone]  Permission to leave phone message Yes     Patient questions:  Do you have a fever, pain , or abdominal swelling? No. Pain Score  0 *  Have you tolerated food without any problems? Yes.    Have you been able to return to your normal activities? Yes.    Do you have any questions about your discharge instructions: Diet   No. Medications  No. Follow up visit  No.  Do you have questions or concerns about your Care? No.  Actions: * If pain score is 4 or above: No action needed, pain <4.

## 2023-11-12 LAB — SURGICAL PATHOLOGY

## 2023-11-15 ENCOUNTER — Encounter: Payer: Self-pay | Admitting: Internal Medicine
